# Patient Record
Sex: Female | Born: 1980
Health system: Southern US, Community
[De-identification: ages and names within clinical notes are randomized; demographics above are authoritative.]

## PROBLEM LIST (undated history)

## (undated) DIAGNOSIS — D219 Benign neoplasm of connective and other soft tissue, unspecified: Secondary | ICD-10-CM

## (undated) DIAGNOSIS — Z8619 Personal history of other infectious and parasitic diseases: Secondary | ICD-10-CM

## (undated) DIAGNOSIS — Z789 Other specified health status: Secondary | ICD-10-CM

## (undated) HISTORY — DX: Personal history of other infectious and parasitic diseases: Z86.19

## (undated) HISTORY — DX: Benign neoplasm of connective and other soft tissue, unspecified: D21.9

## (undated) HISTORY — PX: NO PAST SURGERIES: SHX2092

---
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
## 2000-08-04 ENCOUNTER — Other Ambulatory Visit: Admission: RE | Admit: 2000-08-04 | Discharge: 2000-08-04 | Payer: Self-pay | Admitting: Obstetrics and Gynecology

## 2000-09-29 ENCOUNTER — Encounter: Payer: Self-pay | Admitting: Obstetrics and Gynecology

## 2000-09-29 ENCOUNTER — Ambulatory Visit (HOSPITAL_COMMUNITY): Admission: RE | Admit: 2000-09-29 | Discharge: 2000-09-29 | Payer: Self-pay | Admitting: Obstetrics and Gynecology

## 2005-01-27 HISTORY — PX: AUGMENTATION MAMMAPLASTY: SUR837

## 2007-04-24 ENCOUNTER — Emergency Department (HOSPITAL_COMMUNITY): Admission: EM | Admit: 2007-04-24 | Discharge: 2007-04-24 | Payer: Self-pay | Admitting: Emergency Medicine

## 2009-03-17 ENCOUNTER — Emergency Department (HOSPITAL_COMMUNITY): Admission: EM | Admit: 2009-03-17 | Discharge: 2009-03-17 | Payer: Self-pay | Admitting: Emergency Medicine

## 2009-10-05 ENCOUNTER — Emergency Department (HOSPITAL_COMMUNITY): Admission: EM | Admit: 2009-10-05 | Discharge: 2009-10-05 | Payer: Self-pay | Admitting: Family Medicine

## 2011-02-12 ENCOUNTER — Ambulatory Visit: Payer: Self-pay | Admitting: Internal Medicine

## 2011-03-22 ENCOUNTER — Emergency Department: Payer: Self-pay | Admitting: Internal Medicine

## 2014-05-10 ENCOUNTER — Encounter: Payer: Self-pay | Admitting: *Deleted

## 2014-11-09 LAB — OB RESULTS CONSOLE RPR: RPR: NONREACTIVE

## 2014-11-09 LAB — OB RESULTS CONSOLE ANTIBODY SCREEN: ANTIBODY SCREEN: NEGATIVE

## 2014-11-09 LAB — OB RESULTS CONSOLE ABO/RH: RH TYPE: POSITIVE

## 2014-11-09 LAB — OB RESULTS CONSOLE HIV ANTIBODY (ROUTINE TESTING): HIV: NONREACTIVE

## 2014-11-09 LAB — OB RESULTS CONSOLE HEPATITIS B SURFACE ANTIGEN: Hepatitis B Surface Ag: NEGATIVE

## 2014-11-09 LAB — OB RESULTS CONSOLE RUBELLA ANTIBODY, IGM: Rubella: IMMUNE

## 2014-11-27 LAB — OB RESULTS CONSOLE GC/CHLAMYDIA
Chlamydia: NEGATIVE
GC PROBE AMP, GENITAL: NEGATIVE

## 2014-11-29 ENCOUNTER — Ambulatory Visit: Payer: Self-pay | Admitting: Internal Medicine

## 2014-12-29 ENCOUNTER — Encounter: Payer: Self-pay | Admitting: Internal Medicine

## 2015-01-28 NOTE — L&D Delivery Note (Signed)
Delivery Note At 8:53 AM a viable female was delivered via Vaginal, Spontaneous Delivery (Presentation: ; Occiput Anterior).  APGAR: 9,9 ; weight  pending.   Placenta status: Intact, Spontaneous.  Cord:  with the following complications none: .  Cord pH: not obtained  Anesthesia: Epidural  Episiotomy: None Lacerations: 1st degree;Perineal Suture Repair: 3.0 chromic Est. Blood Loss (mL):  300  Mom to postpartum.  Baby to Couplet care / Skin to Skin.  Ariel Joseph L 06/12/2015, 9:35 AM

## 2015-02-05 DIAGNOSIS — M9903 Segmental and somatic dysfunction of lumbar region: Secondary | ICD-10-CM | POA: Diagnosis not present

## 2015-02-05 DIAGNOSIS — M62830 Muscle spasm of back: Secondary | ICD-10-CM | POA: Diagnosis not present

## 2015-02-08 DIAGNOSIS — M62830 Muscle spasm of back: Secondary | ICD-10-CM | POA: Diagnosis not present

## 2015-02-08 DIAGNOSIS — M9903 Segmental and somatic dysfunction of lumbar region: Secondary | ICD-10-CM | POA: Diagnosis not present

## 2015-03-19 DIAGNOSIS — Z3400 Encounter for supervision of normal first pregnancy, unspecified trimester: Secondary | ICD-10-CM | POA: Diagnosis not present

## 2015-03-19 DIAGNOSIS — Z36 Encounter for antenatal screening of mother: Secondary | ICD-10-CM | POA: Diagnosis not present

## 2015-03-19 DIAGNOSIS — Z23 Encounter for immunization: Secondary | ICD-10-CM | POA: Diagnosis not present

## 2015-05-08 DIAGNOSIS — O365930 Maternal care for other known or suspected poor fetal growth, third trimester, not applicable or unspecified: Secondary | ICD-10-CM | POA: Diagnosis not present

## 2015-05-08 DIAGNOSIS — Z3A34 34 weeks gestation of pregnancy: Secondary | ICD-10-CM | POA: Diagnosis not present

## 2015-05-08 DIAGNOSIS — Z3400 Encounter for supervision of normal first pregnancy, unspecified trimester: Secondary | ICD-10-CM | POA: Diagnosis not present

## 2015-05-23 DIAGNOSIS — Z36 Encounter for antenatal screening of mother: Secondary | ICD-10-CM | POA: Diagnosis not present

## 2015-06-07 LAB — OB RESULTS CONSOLE GBS: GBS: POSITIVE

## 2015-06-12 ENCOUNTER — Encounter (HOSPITAL_COMMUNITY): Payer: Self-pay

## 2015-06-12 ENCOUNTER — Inpatient Hospital Stay (HOSPITAL_COMMUNITY): Payer: 59 | Admitting: Anesthesiology

## 2015-06-12 ENCOUNTER — Inpatient Hospital Stay (HOSPITAL_COMMUNITY)
Admission: AD | Admit: 2015-06-12 | Discharge: 2015-06-13 | DRG: 775 | Disposition: A | Discharge status: Home / Self Care | Payer: 59 | Source: Ambulatory Visit | Attending: Obstetrics and Gynecology | Admitting: Obstetrics and Gynecology

## 2015-06-12 DIAGNOSIS — Z3A39 39 weeks gestation of pregnancy: Secondary | ICD-10-CM

## 2015-06-12 DIAGNOSIS — O99824 Streptococcus B carrier state complicating childbirth: Secondary | ICD-10-CM | POA: Diagnosis present

## 2015-06-12 DIAGNOSIS — Z87891 Personal history of nicotine dependence: Secondary | ICD-10-CM

## 2015-06-12 DIAGNOSIS — IMO0001 Reserved for inherently not codable concepts without codable children: Secondary | ICD-10-CM

## 2015-06-12 DIAGNOSIS — O4202 Full-term premature rupture of membranes, onset of labor within 24 hours of rupture: Principal | ICD-10-CM | POA: Diagnosis present

## 2015-06-12 HISTORY — DX: Other specified health status: Z78.9

## 2015-06-12 LAB — RPR: RPR: NONREACTIVE

## 2015-06-12 LAB — CBC
HCT: 38.7 % (ref 36.0–46.0)
HEMOGLOBIN: 14.7 g/dL (ref 12.0–15.0)
MCH: 35.3 pg — ABNORMAL HIGH (ref 26.0–34.0)
MCHC: 38 g/dL — ABNORMAL HIGH (ref 30.0–36.0)
MCV: 92.8 fL (ref 78.0–100.0)
PLATELETS: 190 10*3/uL (ref 150–400)
RBC: 4.17 MIL/uL (ref 3.87–5.11)
RDW: 13.6 % (ref 11.5–15.5)
WBC: 11.7 10*3/uL — AB (ref 4.0–10.5)

## 2015-06-12 LAB — TYPE AND SCREEN
ABO/RH(D): O POS
ANTIBODY SCREEN: NEGATIVE

## 2015-06-12 LAB — ABO/RH: ABO/RH(D): O POS

## 2015-06-12 MED ORDER — OXYCODONE-ACETAMINOPHEN 5-325 MG PO TABS: 1.0000 | ORAL_TABLET | ORAL | Status: DC | PRN

## 2015-06-12 MED ORDER — PENICILLIN G POTASSIUM 5000000 UNITS IJ SOLR
5.0000 10*6.[IU] | Freq: Once | INTRAVENOUS | Status: AC
  Administered 2015-06-12: 5 10*6.[IU] via INTRAVENOUS
  Filled 2015-06-12: qty 5

## 2015-06-12 MED ORDER — LIDOCAINE HCL (PF) 1 % IJ SOLN
INTRAMUSCULAR | Status: DC | PRN
  Administered 2015-06-12 (×2): 4 mL via EPIDURAL

## 2015-06-12 MED ORDER — LACTATED RINGERS IV SOLN
INTRAVENOUS | Status: DC
  Administered 2015-06-12 (×2): via INTRAVENOUS

## 2015-06-12 MED ORDER — OXYTOCIN BOLUS FROM INFUSION: 500.0000 mL | INTRAVENOUS | Status: DC

## 2015-06-12 MED ORDER — EPHEDRINE 5 MG/ML INJ
10.0000 mg | INTRAVENOUS | Status: DC | PRN
  Filled 2015-06-12: qty 2

## 2015-06-12 MED ORDER — TETANUS-DIPHTH-ACELL PERTUSSIS 5-2.5-18.5 LF-MCG/0.5 IM SUSP: 0.5000 mL | Freq: Once | INTRAMUSCULAR | Status: DC

## 2015-06-12 MED ORDER — ACETAMINOPHEN 325 MG PO TABS
650.0000 mg | ORAL_TABLET | ORAL | Status: DC | PRN
  Administered 2015-06-12: 650 mg via ORAL
  Filled 2015-06-12: qty 2

## 2015-06-12 MED ORDER — PRENATAL MULTIVITAMIN CH
1.0000 | ORAL_TABLET | Freq: Every day | ORAL | Status: DC
  Filled 2015-06-12: qty 1

## 2015-06-12 MED ORDER — MEDROXYPROGESTERONE ACETATE 150 MG/ML IM SUSP: 150.0000 mg | INTRAMUSCULAR | Status: DC | PRN

## 2015-06-12 MED ORDER — DIPHENHYDRAMINE HCL 50 MG/ML IJ SOLN: 12.5000 mg | INTRAMUSCULAR | Status: DC | PRN

## 2015-06-12 MED ORDER — OXYCODONE-ACETAMINOPHEN 5-325 MG PO TABS: 2.0000 | ORAL_TABLET | ORAL | Status: DC | PRN

## 2015-06-12 MED ORDER — MEASLES, MUMPS & RUBELLA VAC ~~LOC~~ INJ
0.5000 mL | INJECTION | Freq: Once | SUBCUTANEOUS | Status: DC
  Filled 2015-06-12: qty 0.5

## 2015-06-12 MED ORDER — ZOLPIDEM TARTRATE 5 MG PO TABS: 5.0000 mg | ORAL_TABLET | Freq: Every evening | ORAL | Status: DC | PRN

## 2015-06-12 MED ORDER — BENZOCAINE-MENTHOL 20-0.5 % EX AERO
1.0000 "application " | INHALATION_SPRAY | CUTANEOUS | Status: DC | PRN
  Administered 2015-06-12: 1 via TOPICAL
  Filled 2015-06-12: qty 56

## 2015-06-12 MED ORDER — PENICILLIN G POTASSIUM 5000000 UNITS IJ SOLR
2.5000 10*6.[IU] | INTRAVENOUS | Status: DC
  Administered 2015-06-12: 2.5 10*6.[IU] via INTRAVENOUS
  Filled 2015-06-12 (×2): qty 2.5

## 2015-06-12 MED ORDER — LIDOCAINE HCL (PF) 1 % IJ SOLN
30.0000 mL | INTRAMUSCULAR | Status: DC | PRN
  Filled 2015-06-12: qty 30

## 2015-06-12 MED ORDER — FLEET ENEMA 7-19 GM/118ML RE ENEM: 1.0000 | ENEMA | RECTAL | Status: DC | PRN

## 2015-06-12 MED ORDER — PHENYLEPHRINE 40 MCG/ML (10ML) SYRINGE FOR IV PUSH (FOR BLOOD PRESSURE SUPPORT)
80.0000 ug | PREFILLED_SYRINGE | INTRAVENOUS | Status: DC | PRN
  Filled 2015-06-12: qty 5

## 2015-06-12 MED ORDER — IBUPROFEN 600 MG PO TABS
600.0000 mg | ORAL_TABLET | Freq: Four times a day (QID) | ORAL | Status: DC
  Administered 2015-06-12 – 2015-06-13 (×4): 600 mg via ORAL
  Filled 2015-06-12 (×5): qty 1

## 2015-06-12 MED ORDER — OXYTOCIN 40 UNITS IN LACTATED RINGERS INFUSION - SIMPLE MED
2.5000 [IU]/h | INTRAVENOUS | Status: DC
  Administered 2015-06-12: 39.96 [IU]/h via INTRAVENOUS
  Filled 2015-06-12: qty 1000

## 2015-06-12 MED ORDER — ACETAMINOPHEN 325 MG PO TABS
650.0000 mg | ORAL_TABLET | ORAL | Status: DC | PRN
  Administered 2015-06-12 – 2015-06-13 (×2): 650 mg via ORAL
  Filled 2015-06-12 (×2): qty 2

## 2015-06-12 MED ORDER — PHENYLEPHRINE 40 MCG/ML (10ML) SYRINGE FOR IV PUSH (FOR BLOOD PRESSURE SUPPORT)
PREFILLED_SYRINGE | INTRAVENOUS | Status: AC
  Filled 2015-06-12: qty 20

## 2015-06-12 MED ORDER — OXYCODONE HCL 5 MG PO TABS: 5.0000 mg | ORAL_TABLET | ORAL | Status: DC | PRN

## 2015-06-12 MED ORDER — ONDANSETRON HCL 4 MG/2ML IJ SOLN: 4.0000 mg | Freq: Four times a day (QID) | INTRAMUSCULAR | Status: DC | PRN

## 2015-06-12 MED ORDER — FLEET ENEMA 7-19 GM/118ML RE ENEM: 1.0000 | ENEMA | Freq: Every day | RECTAL | Status: DC | PRN

## 2015-06-12 MED ORDER — OXYCODONE HCL 5 MG PO TABS: 10.0000 mg | ORAL_TABLET | ORAL | Status: DC | PRN

## 2015-06-12 MED ORDER — SENNOSIDES-DOCUSATE SODIUM 8.6-50 MG PO TABS
2.0000 | ORAL_TABLET | ORAL | Status: DC
  Filled 2015-06-12: qty 2

## 2015-06-12 MED ORDER — DIBUCAINE 1 % RE OINT: 1.0000 "application " | TOPICAL_OINTMENT | RECTAL | Status: DC | PRN

## 2015-06-12 MED ORDER — BISACODYL 10 MG RE SUPP: 10.0000 mg | Freq: Every day | RECTAL | Status: DC | PRN

## 2015-06-12 MED ORDER — LACTATED RINGERS IV SOLN: 500.0000 mL | INTRAVENOUS | Status: DC | PRN

## 2015-06-12 MED ORDER — FENTANYL 2.5 MCG/ML BUPIVACAINE 1/10 % EPIDURAL INFUSION (WH - ANES)
INTRAMUSCULAR | Status: AC
  Filled 2015-06-12: qty 125

## 2015-06-12 MED ORDER — ONDANSETRON HCL 4 MG/2ML IJ SOLN: 4.0000 mg | INTRAMUSCULAR | Status: DC | PRN

## 2015-06-12 MED ORDER — ONDANSETRON HCL 4 MG PO TABS: 4.0000 mg | ORAL_TABLET | ORAL | Status: DC | PRN

## 2015-06-12 MED ORDER — LACTATED RINGERS IV SOLN
500.0000 mL | Freq: Once | INTRAVENOUS | Status: AC
  Administered 2015-06-12: 500 mL via INTRAVENOUS

## 2015-06-12 MED ORDER — DIPHENHYDRAMINE HCL 25 MG PO CAPS
25.0000 mg | ORAL_CAPSULE | Freq: Four times a day (QID) | ORAL | Status: DC | PRN
Start: 2015-06-12 — End: 2015-06-13

## 2015-06-12 MED ORDER — COCONUT OIL OIL: 1.0000 "application " | TOPICAL_OIL | Status: DC | PRN

## 2015-06-12 MED ORDER — FENTANYL 2.5 MCG/ML BUPIVACAINE 1/10 % EPIDURAL INFUSION (WH - ANES)
14.0000 mL/h | INTRAMUSCULAR | Status: DC | PRN
  Administered 2015-06-12 (×2): 14 mL/h via EPIDURAL

## 2015-06-12 MED ORDER — SIMETHICONE 80 MG PO CHEW
80.0000 mg | CHEWABLE_TABLET | ORAL | Status: DC | PRN
  Administered 2015-06-12: 80 mg via ORAL

## 2015-06-12 MED ORDER — CITRIC ACID-SODIUM CITRATE 334-500 MG/5ML PO SOLN: 30.0000 mL | ORAL | Status: DC | PRN

## 2015-06-12 MED ORDER — WITCH HAZEL-GLYCERIN EX PADS
1.0000 | MEDICATED_PAD | CUTANEOUS | Status: DC | PRN
Start: 2015-06-12 — End: 2015-06-13

## 2015-06-12 NOTE — Anesthesia Procedure Notes (Signed)
Epidural Patient location during procedure: OB Start time: 06/12/2015 4:00 AM  Staffing Anesthesiologist: Josephine Igo Performed by: anesthesiologist   Preanesthetic Checklist Completed: patient identified, site marked, surgical consent, pre-op evaluation, timeout performed, IV checked, risks and benefits discussed and monitors and equipment checked  Epidural Patient position: sitting Prep: site prepped and draped and DuraPrep Patient monitoring: continuous pulse ox and blood pressure Approach: midline Location: L3-L4 Injection technique: LOR air  Needle:  Needle type: Tuohy  Needle gauge: 17 G Needle length: 9 cm and 9 Needle insertion depth: 4 cm Catheter type: closed end flexible Catheter size: 19 Gauge Catheter at skin depth: 9 cm Test dose: negative and Other  Assessment Events: blood not aspirated, injection not painful, no injection resistance, negative IV test and no paresthesia  Additional Notes Patient identified. Risks and benefits discussed including failed block, incomplete  Pain control, post dural puncture headache, nerve damage, paralysis, blood pressure Changes, nausea, vomiting, reactions to medications-both toxic and allergic and post Partum back pain. All questions were answered. Patient expressed understanding and wished to proceed. Sterile technique was used throughout procedure. Epidural site was Dressed with sterile barrier dressing. No paresthesias, signs of intravascular injection Or signs of intrathecal spread were encountered.  Patient was more comfortable after the epidural was dosed. Please see RN's note for documentation of vital signs and FHR which are stable.

## 2015-06-12 NOTE — Anesthesia Postprocedure Evaluation (Signed)
Anesthesia Post Note  Patient: Ariel Joseph  Procedure(s) Performed: * No procedures listed *  Patient location during evaluation: Mother Baby Anesthesia Type: Epidural Level of consciousness: awake Pain management: satisfactory to patient Vital Signs Assessment: post-procedure vital signs reviewed and stable Respiratory status: spontaneous breathing Cardiovascular status: stable Anesthetic complications: no Comments: Current pain 2     Last Vitals:  Filed Vitals:   06/12/15 1015 06/12/15 1030  BP: 131/70 129/76  Pulse: 58 67  Temp: 36.9 C   Resp: 18     Last Pain:  Filed Vitals:   06/12/15 1207  PainSc: 0-No pain   Pain Goal: Patients Stated Pain Goal: 7 (06/12/15 0730)               Casimer Lanius

## 2015-06-12 NOTE — Anesthesia Preprocedure Evaluation (Signed)
Anesthesia Evaluation  Patient identified by MRN, date of birth, ID band Patient awake    Reviewed: Allergy & Precautions, H&P , Patient's Chart, lab work & pertinent test results  Airway Mallampati: II  TM Distance: >3 FB Neck ROM: full    Dental no notable dental hx. (+) Teeth Intact   Pulmonary neg pulmonary ROS, former smoker,    Pulmonary exam normal breath sounds clear to auscultation       Cardiovascular negative cardio ROS Normal cardiovascular exam Rhythm:regular Rate:Normal     Neuro/Psych negative neurological ROS  negative psych ROS   GI/Hepatic negative GI ROS, Neg liver ROS,   Endo/Other  negative endocrine ROS  Renal/GU negative Renal ROS  negative genitourinary   Musculoskeletal   Abdominal   Peds  Hematology negative hematology ROS (+)   Anesthesia Other Findings   Reproductive/Obstetrics (+) Pregnancy                             Anesthesia Physical Anesthesia Plan  ASA: II  Anesthesia Plan: Epidural   Post-op Pain Management:    Induction:   Airway Management Planned:   Additional Equipment:   Intra-op Plan:   Post-operative Plan:   Informed Consent: I have reviewed the patients History and Physical, chart, labs and discussed the procedure including the risks, benefits and alternatives for the proposed anesthesia with the patient or authorized representative who has indicated his/her understanding and acceptance.     Plan Discussed with: Anesthesiologist  Anesthesia Plan Comments:         Anesthesia Quick Evaluation

## 2015-06-12 NOTE — Lactation Note (Signed)
This note was copied from a baby's chart. Lactation Consultation Note initial visit at 38 hours of age.  FOB holding baby STS after bath and mom is eating.  RN reports mom attempted feeding, but baby is sleepy.  MOm is [employer] employee and will plan to get DEBP from store. [facility] LC resources given and discussed.  Encouraged to feed with early cues on demand.  Early newborn behavior discussed.  Hand expression demonstrated with drops of colostrum visible with earlier attempts.  LC encouraged mom to keep trying.   Mom to call for assist as needed.    Patient Name: Ariel Joseph [id number] Date: 06/12/2015 Reason for consult: Initial assessment   Maternal Data Has patient been taught Hand Expression?: Yes Does the patient have breastfeeding experience prior to this delivery?: No  Feeding Feeding Type: Breast Fed Length of feed: 20 min  LATCH Score/Interventions                Intervention(s): Breastfeeding basics reviewed     Lactation Tools Discussed/Used WIC Program: No   Consult Status Consult Status: Follow-up Date: 06/13/15 Follow-up type: In-patient    Kendell Bane Justine Null 06/12/2015, 9:58 PM

## 2015-06-12 NOTE — Anesthesia Rounding Note (Addendum)
  [facility] Epidural Rounding Note  Patient: Ariel Joseph, 35 y.o., female  Patient's current pain level: Pain Score: 0-No pain (06/12/15 0730)  Agreed upon pain management level:6  Epidural intervention: No   Comments:   [name] 06/12/2015

## 2015-06-12 NOTE — H&P (Signed)
Ariel Joseph is a 35 y.o. G 1 P 0 at 63 w 3 days presents with SROM. Maternal Medical History:  Reason for admission: Rupture of membranes and contractions.     OB History    Gravida Para Term Preterm AB TAB SAB Ectopic Multiple Living   1              Past Medical History  Diagnosis Date  . Medical history non-contributory    Past Surgical History  Procedure Laterality Date  . No past surgeries    . Wisdom tooth extraction  2000   Family History: family history is not on file. Social History:  reports that she has quit smoking. She has never used smokeless tobacco. She reports that she drinks alcohol. She reports that she does not use illicit drugs.   Prenatal Transfer Tool  Maternal Diabetes: No Genetic Screening: Normal Maternal Ultrasounds/Referrals: Normal Fetal Ultrasounds or other Referrals:  None Maternal Substance Abuse:  No Significant Maternal Medications:  None Significant Maternal Lab Results:  None Other Comments:  None  Review of Systems  All other systems reviewed and are negative.   Dilation: 10 Effacement (%): 70, 80 Station: +2 Exam by:: Nicky Kras Blood pressure 106/53, pulse 48, temperature 98.2 F (36.8 C), temperature source Oral, resp. rate 18, height 5\' 3"  (1.6 m), weight 65.318 kg (144 lb), SpO2 100 %. Maternal Exam:  Abdomen: Fetal presentation: vertex     Fetal Exam Fetal State Assessment: Category I - tracings are normal.     Physical Exam  Prenatal labs: ABO, Rh: --/--/O POS, O POS (05/16 0245) Antibody: NEG (05/16 0245) Rubella: Immune (10/13 0000) RPR: Nonreactive (10/13 0000)  HBsAg: Negative (10/13 0000)  HIV: Non-reactive (10/13 0000)  GBS: Positive (05/11 0000)   Assessment/Plan: IUP at 39 w 3 days Labor Anticipate NSVD   Retia Cordle L 06/12/2015, 8:02 AM

## 2015-06-12 NOTE — Lactation Note (Signed)
This note was copied from a baby's chart. Lactation Consultation Note attempted visit at 25 hours of age.  Baby is about to get bath and mom wants to take pictures.  LC offered to return later and mom is planning to call RN or LC at feeding for LATCH score.  Mom reports she is planning to feed and then will be going to sleep.  Mom will need additional teaching regarding feeding cues and cue based feeding.  [facility] resources left in room for mom to review.     Patient Name: Ariel Joseph [id number] Date: 06/12/2015     Maternal Data    Feeding Feeding Type: Breast Fed Length of feed: 20 min  LATCH Score/Interventions                      Lactation Tools Discussed/Used     Consult Status      Kenli Waldo, Justine Null 06/12/2015, 8:57 PM

## 2015-06-12 NOTE — MAU Note (Signed)
Pt states she felt gush of fluid at 1045. Now having contractions anywhere from 2.5-3 minutes. Denies vag bleeding. +FM

## 2015-06-13 LAB — CBC
HEMATOCRIT: 31.5 % — AB (ref 36.0–46.0)
HEMOGLOBIN: 11.5 g/dL — AB (ref 12.0–15.0)
MCH: 35 pg — ABNORMAL HIGH (ref 26.0–34.0)
MCHC: 36.5 g/dL — ABNORMAL HIGH (ref 30.0–36.0)
MCV: 95.7 fL (ref 78.0–100.0)
Platelets: 160 10*3/uL (ref 150–400)
RBC: 3.29 MIL/uL — ABNORMAL LOW (ref 3.87–5.11)
RDW: 13.9 % (ref 11.5–15.5)
WBC: 11.6 10*3/uL — AB (ref 4.0–10.5)

## 2015-06-13 NOTE — Lactation Note (Signed)
This note was copied from a baby's chart. Lactation Consultation Note  Patient Name: Ariel Joseph [id number] Date: 06/13/2015 Reason for consult: Follow-up assessment  Baby 89 hours old. Parents report that baby sleepy since returning from circumcision this morning. Offered to assist with latching baby, but mom declined stating that she just attempted to latch and baby too sleepy. Enc mom to offer lots of STS and hand express drops of EBM to dribble in baby's mouth or use finger to place in baby's mouth. Discussed how this can often enc baby to wake and nurse. Answered mom's questions about pumping/nursing and returning to work. Referred parents to Baby and Me booklet for number of diapers to expect by day of life and EBM storage guidelines. Parents aware of OP/BFSG and [facility] phone line assistance after D/C.  Maternal Data    Feeding    LATCH Score/Interventions                      Lactation Tools Discussed/Used     Consult Status Consult Status: Complete    Inocente Salles 06/13/2015, 12:35 PM

## 2015-06-13 NOTE — Discharge Summary (Signed)
Obstetric Discharge Summary Reason for Admission: onset of labor Prenatal Procedures: none Intrapartum Procedures: spontaneous vaginal delivery Postpartum Procedures: none Complications-Operative and Postpartum: none HEMOGLOBIN  Date Value Ref Range Status  06/13/2015 11.5* 12.0 - 15.0 g/dL Final    Comment:    DELTA CHECK NOTED REPEATED TO VERIFY    HCT  Date Value Ref Range Status  06/13/2015 31.5* 36.0 - 46.0 % Final    Physical Exam:  General: alert Lochia: appropriate Uterine Fundus: firm Incision: healing well DVT Evaluation: No evidence of DVT seen on physical exam.  Discharge Diagnoses: Term Pregnancy-delivered  Discharge Information: Date: 06/13/2015 Activity: pelvic rest Diet: routine Medications: PNV and Ibuprofen Condition: stable Instructions: refer to practice specific booklet Discharge to: home   Newborn Data: Live born female  Birth Weight: 6 lb 14.1 oz (3121 g) APGAR: 7, 9  Home with mother.  Ariel Joseph M 06/13/2015, 10:45 AM

## 2015-06-30 ENCOUNTER — Telehealth (HOSPITAL_COMMUNITY): Payer: Self-pay | Admitting: Lactation Services

## 2015-06-30 NOTE — Telephone Encounter (Signed)
Mom called Lactation number & left a voicemail about baby being very gassy. Called mom back - pt reports that baby is 63 weeks old, wt gain has been above expected & that baby has been very gassy lately. Mom reports she tried cutting out dairy and several other things but did not notice a change; mom also stated that baby used to BF for ~20 mins but now is only BF for ~5 mins every 1.5hrs & is choking, spitty while BF so wondered if she had a lot of milk so she pumped and got 3 oz in ~4 mins. Mom reports baby's stools are sometimes yellow & sometimes green. Discussed how she could have an overactive let down & baby could be getting mostly foremilk. Encouraged mom to pre-pump for ~5 mins and then try laid-back nursing to see if that helps with flow. Discussed how stools should be yellow by now; encouraged mom to massage breasts while BF to increase fat content in her milk. Mom asked how long she should do this (prepump) & encouraged mom to do it over the next few days before every feeding to see if she sees any changes and if not that she can call us back and we may want to see her for an outpatient appointment. Mom asked if she should cut out food items and discussed how she should try these things first but that if she does cut out food items in the future that it can take 2 weeks for her to see a full change in how baby acts. Mom reports no other questions at this time

## 2015-07-23 DIAGNOSIS — Z1389 Encounter for screening for other disorder: Secondary | ICD-10-CM | POA: Diagnosis not present

## 2015-08-03 ENCOUNTER — Ambulatory Visit (HOSPITAL_COMMUNITY)
Admission: RE | Admit: 2015-08-03 | Discharge: 2015-08-03 | Disposition: A | Discharge status: Home / Self Care | Payer: 59 | Source: Ambulatory Visit | Attending: Obstetrics and Gynecology | Admitting: Obstetrics and Gynecology

## 2015-08-03 NOTE — Lactation Note (Signed)
Lactation Consult  Mother's reason for visit:  Visit Type:  Colic, latch assessment, evaluate tongue function Appointment Notes:  Patient is here today with Ariel Joseph related to colic.  She has had him to [facility] for body work.  He is also on zantac for reflux but it not helping ease his pain. Latch assessment reveals that he does not maintain the seal and comes off of the breast several times during the feeding though he has good jaw movement.  Suggested ways to position him to optimize attachment. Ariel Joseph has only been feeding on one breast but mom was encouraged to feed on both sides to help protect her supply and help Ariel Joseph gain more than the minimum weight.  The transfer from the first breast was 1.8 oz. After the second side the total transfer was 3 oz.  He was very uncomfortable and was difficult to console after the feeding. Ariel Joseph has an anterior bubble palate and his tongue does not elevate when he cries. He has a thick lingual frenum and gags when he tries to pull a gloved finger deeply into his mouth.  Mom has been working with him for 7 weeks and is weary.  She was given the names of Drs. McMurty, Crista Curb and Hisaw should she seek further evaluation for "tongue-tie".  Encouraged to follow-up with lactation if Ariel Joseph is revised. Consult:  Initial Lactation Consultant:  Ariel Joseph  ________________________________________________________________________ Ariel Joseph Name: Ariel Joseph Date of Birth: 06/12/2015 Pediatrician: Dr Corinna Capra Gender: female Gestational Age: [redacted]w[redacted]d (At Birth) Birth Weight: 6 lb 14.1 oz (3121 g) Weight at Discharge: Weight: 6 lb 11.2 oz (3040 g)Date of Discharge: 06/13/2015 Filed Weights   06/12/15 0853 06/13/15 0000  Weight: 6 lb 14.1 oz (3121 g) 6 lb 11.2 oz (3040 g)   Last weight taken from location outside of [facility]: 07/12/2015 9 #4 oz Location:Pediatrician's office Weight today: 10# 4.7       ________________________________________________________________________  Mother's Name: Ariel Joseph Type of delivery:   Breastfeeding Experience:  First baby Maternal Medical Conditions:   Maternal Medications:    ________________________________________________________________________  Breastfeeding History (Post Discharge)  Frequency of breastfeeding:  2-3 hours Duration of feeding:  5-7 minutes   Infant Intake and Output Assessment  Voids:  8-10 in 24 hrs.  Color:  Clear yellow Stools:  5-6 in 24 hrs.  Color:  Yellow

## 2015-08-14 ENCOUNTER — Ambulatory Visit (HOSPITAL_COMMUNITY)
Admission: RE | Admit: 2015-08-14 | Discharge: 2015-08-14 | Disposition: A | Discharge status: Home / Self Care | Payer: 59 | Source: Ambulatory Visit | Attending: Obstetrics and Gynecology | Admitting: Obstetrics and Gynecology

## 2015-08-14 NOTE — Lactation Note (Signed)
Lactation Consult  Mother's reason for visit Mother states that :infant  cried for 8 weeks with colic and reflux. Mother  took infant to a Engineer, drilling and she saw that infant had a tongue tie and a lip tie. Mother took infant to Dr Thurman Coyer one week ago. Luisa Hart is 7 day post revision today.  Visit Type:  Appointment Notes:  Consult:  Initial Lactation Consultant:  Darla Lesches  ________________________________________________________________________    ________________________________________________________________________  Mother's Name: [name] Type of delivery:  vaginal del  Breastfeeding Experience:  none Maternal Medical Conditions:  none Maternal Medications:  Prenatal vits  ________________________________________________________________________  Breastfeeding History (Post Discharge)  Frequency of breastfeeding:  Every 2-3 hours Duration of feeding:  10-15 mins    Pumping  Type of pump:  Medela pump in style Frequency:  Once daily Volume:  5 ounces  Infant Intake and Output Assessment  Voids 10:  in 24 hrs.  Color:  Clear yellow Stools:  5-6  in 24 hrs.  Color:  Yellow  ________________________________________________________________________  Maternal Breast Assessment  Breast:  Full Nipple:  Erect Pain level:  0 Pain interventions:  Bra  _______________________________________________________________________  Initial feeding assessment:Mother latched infant on the left breast with good technique. Infants latch was wide with good depth. Both upper and lower lips flanged widely.  Infant transferred 79 ml for first breasts Mother latched infant on the alternate breast with good depth.   Infant's oral assessment:  WNL  Positioning:  Cross cradle Left breast  LATCH documentation:  Latch:  2 = Grasps breast easily, tongue down, lips flanged, rhythmical sucking.  Audible swallowing:  2 = Spontaneous and intermittent  Type of  nipple:  2 = Everted at rest and after stimulation  Comfort (Breast/Nipple):  1 = Filling, red/small blisters or bruises, mild/mod discomfort  Hold (Positioning):  2 = No assistance needed to correctly position infant at breast  LATCH score:  9  Attached assessment:  Shallow  Lips flanged:  Yes.    Lips untucked:  Yes.    Suck assessment:  Displays both  Pre-feed weight:  4972 Post-feed weight:  5028 Amount transferred: 56 ml     Infant's oral assessment:  Variance  Positioning:  Cross cradle Right breast  LATCH documentation:  Latch:  2 = Grasps breast easily, tongue down, lips flanged, rhythmical sucking.  Audible swallowing:  2 = Spontaneous and intermittent  Type of nipple:  2 = Everted at rest and after stimulation  Comfort (Breast/Nipple):  1 = Filling, red/small blisters or bruises, mild/mod discomfort  Hold (Positioning):  2 = No assistance needed to correctly position infant at breast  LATCH score:  9  Attached assessment:  Deep  Lips flanged:  Yes.    Lips untucked:  Yes.    Suck assessment:  Displays both    Pre-feed weight: 5028  Post-feed weight:  5052 Amount transferred:24 ml   Total amount transferred:  80 ml  Advised mother to continue to breastfeed every 2-3 hours Mother to continue to do tongue stretches per Dr Micheline Chapman Mother to continue to post pump once to twice  Daily Introduce a bottle daily for getting ready to go back to work. Follow up with PEDS for 4 month visit LC prn

## 2016-07-08 DIAGNOSIS — N911 Secondary amenorrhea: Secondary | ICD-10-CM | POA: Diagnosis not present

## 2016-07-17 DIAGNOSIS — Z3480 Encounter for supervision of other normal pregnancy, unspecified trimester: Secondary | ICD-10-CM | POA: Diagnosis not present

## 2016-07-17 LAB — OB RESULTS CONSOLE GC/CHLAMYDIA
Chlamydia: NEGATIVE
GC PROBE AMP, GENITAL: NEGATIVE

## 2016-07-17 LAB — OB RESULTS CONSOLE HEPATITIS B SURFACE ANTIGEN: HEP B S AG: NEGATIVE

## 2016-07-17 LAB — OB RESULTS CONSOLE RUBELLA ANTIBODY, IGM: Rubella: IMMUNE

## 2016-07-17 LAB — OB RESULTS CONSOLE HIV ANTIBODY (ROUTINE TESTING): HIV: NONREACTIVE

## 2016-07-17 LAB — OB RESULTS CONSOLE ABO/RH: RH Type: POSITIVE

## 2016-07-17 LAB — OB RESULTS CONSOLE ANTIBODY SCREEN: ANTIBODY SCREEN: NEGATIVE

## 2016-08-14 DIAGNOSIS — Z3491 Encounter for supervision of normal pregnancy, unspecified, first trimester: Secondary | ICD-10-CM | POA: Diagnosis not present

## 2016-08-14 DIAGNOSIS — O09521 Supervision of elderly multigravida, first trimester: Secondary | ICD-10-CM | POA: Diagnosis not present

## 2016-08-14 DIAGNOSIS — Z3A12 12 weeks gestation of pregnancy: Secondary | ICD-10-CM | POA: Diagnosis not present

## 2016-08-14 DIAGNOSIS — Z3682 Encounter for antenatal screening for nuchal translucency: Secondary | ICD-10-CM | POA: Diagnosis not present

## 2016-08-14 DIAGNOSIS — Z3480 Encounter for supervision of other normal pregnancy, unspecified trimester: Secondary | ICD-10-CM | POA: Diagnosis not present

## 2016-09-30 DIAGNOSIS — Z363 Encounter for antenatal screening for malformations: Secondary | ICD-10-CM | POA: Diagnosis not present

## 2016-09-30 DIAGNOSIS — Z3A19 19 weeks gestation of pregnancy: Secondary | ICD-10-CM | POA: Diagnosis not present

## 2016-09-30 DIAGNOSIS — Z3400 Encounter for supervision of normal first pregnancy, unspecified trimester: Secondary | ICD-10-CM | POA: Diagnosis not present

## 2016-11-25 DIAGNOSIS — Z3480 Encounter for supervision of other normal pregnancy, unspecified trimester: Secondary | ICD-10-CM | POA: Diagnosis not present

## 2016-11-25 DIAGNOSIS — Z23 Encounter for immunization: Secondary | ICD-10-CM | POA: Diagnosis not present

## 2016-11-25 DIAGNOSIS — Z3492 Encounter for supervision of normal pregnancy, unspecified, second trimester: Secondary | ICD-10-CM | POA: Diagnosis not present

## 2016-12-04 ENCOUNTER — Ambulatory Visit: Payer: Self-pay | Attending: Obstetrics and Gynecology

## 2016-12-11 ENCOUNTER — Ambulatory Visit: Payer: 59

## 2017-01-07 DIAGNOSIS — M9902 Segmental and somatic dysfunction of thoracic region: Secondary | ICD-10-CM | POA: Diagnosis not present

## 2017-01-07 DIAGNOSIS — M9903 Segmental and somatic dysfunction of lumbar region: Secondary | ICD-10-CM | POA: Diagnosis not present

## 2017-01-07 DIAGNOSIS — M542 Cervicalgia: Secondary | ICD-10-CM | POA: Diagnosis not present

## 2017-01-07 DIAGNOSIS — M545 Low back pain: Secondary | ICD-10-CM | POA: Diagnosis not present

## 2017-01-07 DIAGNOSIS — M9905 Segmental and somatic dysfunction of pelvic region: Secondary | ICD-10-CM | POA: Diagnosis not present

## 2017-01-07 DIAGNOSIS — M9901 Segmental and somatic dysfunction of cervical region: Secondary | ICD-10-CM | POA: Diagnosis not present

## 2017-01-22 DIAGNOSIS — Z3480 Encounter for supervision of other normal pregnancy, unspecified trimester: Secondary | ICD-10-CM | POA: Diagnosis not present

## 2017-01-26 LAB — OB RESULTS CONSOLE GBS: GBS: NEGATIVE

## 2017-02-18 ENCOUNTER — Telehealth (HOSPITAL_COMMUNITY): Payer: Self-pay | Admitting: *Deleted

## 2017-02-18 ENCOUNTER — Encounter (HOSPITAL_COMMUNITY): Payer: Self-pay | Admitting: *Deleted

## 2017-02-18 NOTE — Telephone Encounter (Signed)
Preadmission screen  

## 2017-02-20 NOTE — H&P (Signed)
Ariel Joseph is a 37 y.o. female presenting for two stage IOL. Pregnancy uncomlicated.  OB History    Gravida Para Term Preterm AB Living   2 1 1     1    SAB TAB Ectopic Multiple Live Births         0 1     Past Medical History:  Diagnosis Date  . Fibroid   . Hx of chlamydia infection   . Medical history non-contributory    Past Surgical History:  Procedure Laterality Date  . NO PAST SURGERIES    . WISDOM TOOTH EXTRACTION  2000   Family History: family history includes Bipolar disorder in her maternal grandfather; Diabetes in her maternal grandmother; Drug abuse in her maternal aunt; Hypertension in her brother and maternal grandmother; Kidney cancer in her maternal grandfather; Lung cancer in her maternal grandfather; Thrombosis in her mother; Thyroid disease in her father and mother. Social History:  reports that she has quit smoking. she has never used smokeless tobacco. She reports that she drinks alcohol. She reports that she does not use drugs.     Maternal Diabetes: No Genetic Screening: Normal Maternal Ultrasounds/Referrals: Normal Fetal Ultrasounds or other Referrals:  None Maternal Substance Abuse:  No Significant Maternal Medications:  None Significant Maternal Lab Results:  None Other Comments:  None  Review of Systems  Eyes: Negative for blurred vision.  Gastrointestinal: Negative for abdominal pain.  Neurological: Negative for headaches.   Maternal Medical History:  Fetal activity: Perceived fetal activity is normal.        Last menstrual period 05/18/2016, unknown if currently breastfeeding. Maternal Exam:  Abdomen: Fetal presentation: vertex     Physical Exam  Cardiovascular: Normal rate and regular rhythm.  Respiratory: Effort normal and breath sounds normal.  GI: Soft. There is no tenderness.  Neurological: She has normal reflexes.    Cx=2/th/vtx 02/17/17  Prenatal labs: ABO, Rh: O/Positive/-- (06/21 0000) Antibody: Negative (06/21  0000) Rubella: Immune (06/21 0000) RPR:    HBsAg: Negative (06/21 0000)  HIV: Non-reactive (06/21 0000)  GBS:     Assessment/Plan: 37 yo G2P1 for two stage IOL-risks reviewed   Shon Millet II 02/20/2017, 2:03 PM

## 2017-02-23 ENCOUNTER — Inpatient Hospital Stay (HOSPITAL_COMMUNITY): Payer: 59 | Admitting: Anesthesiology

## 2017-02-23 ENCOUNTER — Other Ambulatory Visit: Payer: Self-pay

## 2017-02-23 ENCOUNTER — Inpatient Hospital Stay (HOSPITAL_COMMUNITY)
Admission: RE | Admit: 2017-02-23 | Discharge: 2017-02-25 | DRG: 807 | Disposition: A | Discharge status: Home / Self Care | Payer: 59 | Source: Ambulatory Visit | Attending: Obstetrics and Gynecology | Admitting: Obstetrics and Gynecology

## 2017-02-23 ENCOUNTER — Encounter (HOSPITAL_COMMUNITY): Payer: Self-pay

## 2017-02-23 VITALS — BP 107/83 | HR 50 | Temp 98.1°F | Resp 18 | Ht 63.0 in | Wt 145.7 lb

## 2017-02-23 DIAGNOSIS — Z3A40 40 weeks gestation of pregnancy: Secondary | ICD-10-CM

## 2017-02-23 DIAGNOSIS — Z3483 Encounter for supervision of other normal pregnancy, third trimester: Secondary | ICD-10-CM | POA: Diagnosis present

## 2017-02-23 DIAGNOSIS — Z87891 Personal history of nicotine dependence: Secondary | ICD-10-CM | POA: Diagnosis not present

## 2017-02-23 DIAGNOSIS — O480 Post-term pregnancy: Secondary | ICD-10-CM | POA: Diagnosis present

## 2017-02-23 LAB — CBC
HCT: 35.5 % — ABNORMAL LOW (ref 36.0–46.0)
HEMOGLOBIN: 13.1 g/dL (ref 12.0–15.0)
MCH: 35.4 pg — AB (ref 26.0–34.0)
MCHC: 36.9 g/dL — ABNORMAL HIGH (ref 30.0–36.0)
MCV: 95.9 fL (ref 78.0–100.0)
Platelets: 179 10*3/uL (ref 150–400)
RBC: 3.7 MIL/uL — AB (ref 3.87–5.11)
RDW: 13.9 % (ref 11.5–15.5)
WBC: 10.6 10*3/uL — AB (ref 4.0–10.5)

## 2017-02-23 LAB — TYPE AND SCREEN
ABO/RH(D): O POS
Antibody Screen: NEGATIVE

## 2017-02-23 LAB — RPR: RPR: NONREACTIVE

## 2017-02-23 MED ORDER — OXYCODONE-ACETAMINOPHEN 5-325 MG PO TABS: 2.0000 | ORAL_TABLET | ORAL | Status: DC | PRN

## 2017-02-23 MED ORDER — ONDANSETRON HCL 4 MG/2ML IJ SOLN: 4.0000 mg | Freq: Four times a day (QID) | INTRAMUSCULAR | Status: DC | PRN

## 2017-02-23 MED ORDER — LIDOCAINE HCL (PF) 1 % IJ SOLN: 30.0000 mL | INTRAMUSCULAR | Status: DC | PRN

## 2017-02-23 MED ORDER — EPHEDRINE 5 MG/ML INJ
10.0000 mg | INTRAVENOUS | Status: DC | PRN
  Filled 2017-02-23: qty 4

## 2017-02-23 MED ORDER — LIDOCAINE HCL (PF) 1 % IJ SOLN
INTRAMUSCULAR | Status: DC | PRN
  Administered 2017-02-23: 6 mL via EPIDURAL
  Administered 2017-02-23: 4 mL via EPIDURAL

## 2017-02-23 MED ORDER — SENNOSIDES-DOCUSATE SODIUM 8.6-50 MG PO TABS
2.0000 | ORAL_TABLET | ORAL | Status: DC
  Administered 2017-02-23 – 2017-02-24 (×2): 2 via ORAL
  Filled 2017-02-23 (×2): qty 2

## 2017-02-23 MED ORDER — PRENATAL MULTIVITAMIN CH
1.0000 | ORAL_TABLET | Freq: Every day | ORAL | Status: DC
  Administered 2017-02-24: 1 via ORAL
  Filled 2017-02-23: qty 1

## 2017-02-23 MED ORDER — TERBUTALINE SULFATE 1 MG/ML IJ SOLN: 0.2500 mg | Freq: Once | INTRAMUSCULAR | Status: DC | PRN

## 2017-02-23 MED ORDER — OXYCODONE HCL 5 MG PO TABS: 5.0000 mg | ORAL_TABLET | ORAL | Status: DC | PRN

## 2017-02-23 MED ORDER — TETANUS-DIPHTH-ACELL PERTUSSIS 5-2.5-18.5 LF-MCG/0.5 IM SUSP
0.5000 mL | Freq: Once | INTRAMUSCULAR | Status: DC
  Filled 2017-02-23: qty 0.5

## 2017-02-23 MED ORDER — ONDANSETRON HCL 4 MG PO TABS: 4.0000 mg | ORAL_TABLET | ORAL | Status: DC | PRN

## 2017-02-23 MED ORDER — COCONUT OIL OIL
1.0000 "application " | TOPICAL_OIL | Status: DC | PRN
  Filled 2017-02-23: qty 120

## 2017-02-23 MED ORDER — OXYTOCIN 40 UNITS IN LACTATED RINGERS INFUSION - SIMPLE MED: 1.0000 m[IU]/min | INTRAVENOUS | Status: DC

## 2017-02-23 MED ORDER — BUTORPHANOL TARTRATE 1 MG/ML IJ SOLN: 1.0000 mg | INTRAMUSCULAR | Status: DC | PRN

## 2017-02-23 MED ORDER — LACTATED RINGERS IV SOLN
500.0000 mL | INTRAVENOUS | Status: DC | PRN
  Administered 2017-02-23: 500 mL via INTRAVENOUS

## 2017-02-23 MED ORDER — ACETAMINOPHEN 325 MG PO TABS
650.0000 mg | ORAL_TABLET | ORAL | Status: DC | PRN
  Administered 2017-02-23: 650 mg via ORAL
  Filled 2017-02-23: qty 2

## 2017-02-23 MED ORDER — SOD CITRATE-CITRIC ACID 500-334 MG/5ML PO SOLN: 30.0000 mL | ORAL | Status: DC | PRN

## 2017-02-23 MED ORDER — WITCH HAZEL-GLYCERIN EX PADS
1.0000 "application " | MEDICATED_PAD | CUTANEOUS | Status: DC | PRN
  Administered 2017-02-24: 1 via TOPICAL

## 2017-02-23 MED ORDER — PHENYLEPHRINE 40 MCG/ML (10ML) SYRINGE FOR IV PUSH (FOR BLOOD PRESSURE SUPPORT)
80.0000 ug | PREFILLED_SYRINGE | INTRAVENOUS | Status: DC | PRN
  Filled 2017-02-23 (×2): qty 10

## 2017-02-23 MED ORDER — ONDANSETRON HCL 4 MG/2ML IJ SOLN: 4.0000 mg | INTRAMUSCULAR | Status: DC | PRN

## 2017-02-23 MED ORDER — PHENYLEPHRINE 40 MCG/ML (10ML) SYRINGE FOR IV PUSH (FOR BLOOD PRESSURE SUPPORT)
80.0000 ug | PREFILLED_SYRINGE | INTRAVENOUS | Status: AC | PRN
  Administered 2017-02-23 (×3): 80 ug via INTRAVENOUS

## 2017-02-23 MED ORDER — ZOLPIDEM TARTRATE 5 MG PO TABS: 5.0000 mg | ORAL_TABLET | Freq: Every evening | ORAL | Status: DC | PRN

## 2017-02-23 MED ORDER — SIMETHICONE 80 MG PO CHEW: 80.0000 mg | CHEWABLE_TABLET | ORAL | Status: DC | PRN

## 2017-02-23 MED ORDER — FLEET ENEMA 7-19 GM/118ML RE ENEM: 1.0000 | ENEMA | RECTAL | Status: DC | PRN

## 2017-02-23 MED ORDER — IBUPROFEN 600 MG PO TABS
600.0000 mg | ORAL_TABLET | Freq: Four times a day (QID) | ORAL | Status: DC
  Administered 2017-02-23 – 2017-02-25 (×6): 600 mg via ORAL
  Filled 2017-02-23 (×5): qty 1

## 2017-02-23 MED ORDER — OXYCODONE-ACETAMINOPHEN 5-325 MG PO TABS: 1.0000 | ORAL_TABLET | ORAL | Status: DC | PRN

## 2017-02-23 MED ORDER — BENZOCAINE-MENTHOL 20-0.5 % EX AERO
1.0000 "application " | INHALATION_SPRAY | CUTANEOUS | Status: DC | PRN
  Administered 2017-02-23: 1 via TOPICAL
  Filled 2017-02-23 (×2): qty 56

## 2017-02-23 MED ORDER — OXYTOCIN BOLUS FROM INFUSION: 500.0000 mL | Freq: Once | INTRAVENOUS | Status: DC

## 2017-02-23 MED ORDER — OXYCODONE HCL 5 MG PO TABS: 10.0000 mg | ORAL_TABLET | ORAL | Status: DC | PRN

## 2017-02-23 MED ORDER — OXYTOCIN 40 UNITS IN LACTATED RINGERS INFUSION - SIMPLE MED
2.5000 [IU]/h | INTRAVENOUS | Status: DC
  Administered 2017-02-23: 2.5 [IU]/h via INTRAVENOUS
  Filled 2017-02-23: qty 1000

## 2017-02-23 MED ORDER — FENTANYL 2.5 MCG/ML BUPIVACAINE 1/10 % EPIDURAL INFUSION (WH - ANES)
14.0000 mL/h | INTRAMUSCULAR | Status: DC | PRN
  Administered 2017-02-23: 14 mL/h via EPIDURAL
  Filled 2017-02-23: qty 100

## 2017-02-23 MED ORDER — LACTATED RINGERS IV SOLN
INTRAVENOUS | Status: DC
  Administered 2017-02-23 (×2): via INTRAVENOUS

## 2017-02-23 MED ORDER — MISOPROSTOL 25 MCG QUARTER TABLET
25.0000 ug | ORAL_TABLET | ORAL | Status: DC | PRN
Start: 2017-02-23 — End: 2017-02-23
  Administered 2017-02-23 (×2): 25 ug via VAGINAL
  Filled 2017-02-23 (×2): qty 1

## 2017-02-23 MED ORDER — DIPHENHYDRAMINE HCL 50 MG/ML IJ SOLN: 12.5000 mg | INTRAMUSCULAR | Status: DC | PRN

## 2017-02-23 MED ORDER — LACTATED RINGERS IV SOLN
500.0000 mL | Freq: Once | INTRAVENOUS | Status: AC
  Administered 2017-02-23: 12:00:00 via INTRAVENOUS

## 2017-02-23 MED ORDER — DIBUCAINE 1 % RE OINT
1.0000 "application " | TOPICAL_OINTMENT | RECTAL | Status: DC | PRN
  Administered 2017-02-24: 1 via RECTAL
  Filled 2017-02-23 (×2): qty 28

## 2017-02-23 MED ORDER — EPHEDRINE 5 MG/ML INJ
10.0000 mg | INTRAVENOUS | Status: DC | PRN
  Administered 2017-02-23: 10 mg via INTRAVENOUS

## 2017-02-23 MED ORDER — ACETAMINOPHEN 325 MG PO TABS
650.0000 mg | ORAL_TABLET | ORAL | Status: DC | PRN
  Administered 2017-02-24 – 2017-02-25 (×4): 650 mg via ORAL
  Filled 2017-02-23 (×4): qty 2

## 2017-02-23 MED ORDER — DIPHENHYDRAMINE HCL 25 MG PO CAPS: 25.0000 mg | ORAL_CAPSULE | Freq: Four times a day (QID) | ORAL | Status: DC | PRN

## 2017-02-23 NOTE — Anesthesia Procedure Notes (Signed)
Epidural Patient location during procedure: OB Start time: 02/23/2017 12:48 PM End time: 02/23/2017 12:51 PM  Staffing Anesthesiologist: Lyn Hollingshead, MD Performed: anesthesiologist   Preanesthetic Checklist Completed: patient identified, site marked, surgical consent, pre-op evaluation, timeout performed, IV checked, risks and benefits discussed and monitors and equipment checked  Epidural Patient position: sitting Prep: site prepped and draped and DuraPrep Patient monitoring: continuous pulse ox and blood pressure Approach: midline Location: L3-L4 Injection technique: LOR air  Needle:  Needle type: Tuohy  Needle gauge: 17 G Needle length: 9 cm and 9 Needle insertion depth: 4 cm Catheter type: closed end flexible Catheter size: 19 Gauge Catheter at skin depth: 9 cm Test dose: negative  Assessment Sensory level: T9 Events: blood not aspirated, injection not painful, no injection resistance, negative IV test and no paresthesia  Additional Notes Reason for block:procedure for pain

## 2017-02-23 NOTE — Progress Notes (Signed)
Delivery Note At 6:27 PM a viable female was delivered via Vaginal, Spontaneous (Presentation: ROA;  ).  APGAR: , ; weight  .   Placenta status:intact , .  Cord: 3 vessel with the following complications: .  Cord pH: not done Vertex delivered>right posterior hand under chin>right posterior arm delivered without difficulty>complete delivery. Loose nuchal cord x 1 reduced at delivery. Anesthesia:  epidural Episiotomy: None Lacerations: small transverse second degree lac at 6:00 repaired  Suture Repair: 2.0 vicryl rapide Est. Blood Loss (mL):  250 Mom to postpartum.  Baby to Couplet care / Skin to Skin.  Shon Millet II 02/23/2017, 6:39 PM

## 2017-02-23 NOTE — Progress Notes (Signed)
FHT cat one UCs irreg  Cx=2/30 per nurse check Second Cytotec about 5:45 am  A/P: Will check cx in about 2-3 hours          D/W patient

## 2017-02-23 NOTE — Anesthesia Preprocedure Evaluation (Signed)
Anesthesia Evaluation  Patient identified by MRN, date of birth, ID band Patient awake    Reviewed: Allergy & Precautions, H&P , NPO status , Patient's Chart, lab work & pertinent test results  Airway Mallampati: I  TM Distance: >3 FB Neck ROM: full    Dental no notable dental hx. (+) Teeth Intact   Pulmonary former smoker,    Pulmonary exam normal breath sounds clear to auscultation       Cardiovascular negative cardio ROS Normal cardiovascular exam Rhythm:regular Rate:Normal     Neuro/Psych negative neurological ROS  negative psych ROS   GI/Hepatic negative GI ROS, Neg liver ROS,   Endo/Other  negative endocrine ROS  Renal/GU negative Renal ROS  negative genitourinary   Musculoskeletal negative musculoskeletal ROS (+)   Abdominal Normal abdominal exam  (+)   Peds  Hematology negative hematology ROS (+)   Anesthesia Other Findings   Reproductive/Obstetrics (+) Pregnancy                             Anesthesia Physical  Anesthesia Plan  ASA: II  Anesthesia Plan: Epidural   Post-op Pain Management:    Induction:   PONV Risk Score and Plan:   Airway Management Planned:   Additional Equipment:   Intra-op Plan:   Post-operative Plan:   Informed Consent: I have reviewed the patients History and Physical, chart, labs and discussed the procedure including the risks, benefits and alternatives for the proposed anesthesia with the patient or authorized representative who has indicated his/her understanding and acceptance.     Plan Discussed with: Anesthesiologist  Anesthesia Plan Comments:         Anesthesia Quick Evaluation

## 2017-02-23 NOTE — Anesthesia Pain Management Evaluation Note (Signed)
  CRNA Pain Management Visit Note  Patient: Ariel Joseph, 37 y.o., female  "Hello I am a member of the anesthesia team at [facility]. We have an anesthesia team available at all times to provide care throughout the hospital, including epidural management and anesthesia for C-section. I don't know your plan for the delivery whether it a natural birth, water birth, IV sedation, nitrous supplementation, doula or epidural, but we want to meet your pain goals."   1.Was your pain managed to your expectations on prior hospitalizations?   Yes   2.What is your expectation for pain management during this hospitalization?     Epidural  3.How can we help you reach that goal? epidural  Record the patient's initial score and the patient's pain goal.   Pain: 0  Pain Goal: 4 [facility] wants you to be able to say your pain was always managed very well.  Willa Rough 02/23/2017

## 2017-02-23 NOTE — Progress Notes (Signed)
3-4/70/-2/vtx FHT cat one UC q2-4 min  A/P: If UCs space out>begin pitocin         Epidural prn         D/W patient

## 2017-02-23 NOTE — Progress Notes (Signed)
4/C/-1/well applied vtx AROM scant clear fluid FHT cat one, good response to scalp stim UCs q2-3 min Epidural in

## 2017-02-24 LAB — CBC
HCT: 31.3 % — ABNORMAL LOW (ref 36.0–46.0)
Hemoglobin: 11.3 g/dL — ABNORMAL LOW (ref 12.0–15.0)
MCH: 34.9 pg — ABNORMAL HIGH (ref 26.0–34.0)
MCHC: 36.1 g/dL — AB (ref 30.0–36.0)
MCV: 96.6 fL (ref 78.0–100.0)
PLATELETS: 190 10*3/uL (ref 150–400)
RBC: 3.24 MIL/uL — ABNORMAL LOW (ref 3.87–5.11)
RDW: 13.9 % (ref 11.5–15.5)
WBC: 17.1 10*3/uL — AB (ref 4.0–10.5)

## 2017-02-24 NOTE — Progress Notes (Signed)
Post Partum Day 1 Subjective: no complaints  Objective: Blood pressure (!) 99/45, pulse (!) 54, temperature 97.9 F (36.6 C), temperature source Oral, resp. rate 18, height 5\' 3"  (1.6 m), weight 145 lb 11.2 oz (66.1 kg), last menstrual period 05/18/2016, SpO2 100 %, unknown if currently breastfeeding.  Physical Exam:  General: alert and cooperative Lochia: appropriate Uterine Fundus: firm Incision: n/a DVT Evaluation: No evidence of DVT seen on physical exam.  Recent Labs    02/23/17 0050 02/24/17 0543  HGB 13.1 11.3*  HCT 35.5* 31.3*    Assessment/Plan: Plan for discharge tomorrow and Breastfeeding  Desires circ - r/b/a discussed, informed consent   LOS: 1 day   Marylynn Pearson 02/24/2017, 7:37 AM

## 2017-02-24 NOTE — Progress Notes (Signed)
Patient requested no bedside reporting. Mom requested this comment to be put in her chart.

## 2017-02-24 NOTE — Lactation Note (Signed)
This note was copied from a baby's chart. Lactation Consultation Note Baby 7 hrs old. Has been feeding long periods off and on since birth.  Mom has a 71 month old that she BF up until she found out she was pregnant. Mom had no milk supply issue. Main issue mom had was baby had labial and posterior tongue tie that was clipped at 70 weeks old. Mom stated it was a world of difference after that.  This baby is popping off and on frequently. Encouraged support, comfort, and props during feedings to obtain a deeper latch.  Mom has tubular breast w/2 1/2 fingers width between breast. Everted compressible nipples that mom easily hand expresses colostrum.  Mom prefers cross cradle position. Discussed positions, cheeks to breast, chin tug, and breast compression/massage occasionaly during feeding.  Mom discussed BF obstacles that she had w/her 1st child such as fast let down and baby having projectile vomiting. Had several [place] consults at [facility] and [facility] LC's suggested mom to pre-pump some of the fore-milk to help w/gas the baby had. Mom stated her 1st child finally BF got better after tongue tie fix. Had no issues after that.  Discussed cluster feeding, and newborn behavior. Encouraged mom to call for assistance or questions. Mom has her pump from last baby and is in good condition, doesn't need another pump. [place] brochure given w/resources, support groups and [place] services. Patient Name: Ariel Joseph YJEHU'D Date: 02/24/2017 Reason for consult: Initial assessment   Maternal Data Has patient been taught Hand Expression?: Yes Does the patient have breastfeeding experience prior to this delivery?: Yes  Feeding Feeding Type: Breast Fed Length of feed: 40 min  LATCH Score Latch: Grasps breast easily, tongue down, lips flanged, rhythmical sucking.  Audible Swallowing: Spontaneous and intermittent  Type of Nipple: Everted at rest and after stimulation  Comfort (Breast/Nipple): Soft /  non-tender  Hold (Positioning): Assistance needed to correctly position infant at breast and maintain latch.  LATCH Score: 9  Interventions Interventions: Breast feeding basics reviewed;Position options;Support pillows;Breast massage;Skin to skin;Adjust position;Breast compression  Lactation Tools Discussed/Used WIC Program: No   Consult Status Consult Status: Follow-up Date: 02/25/17 Follow-up type: In-patient    Kymere Fullington, Elta Guadeloupe 02/24/2017, 2:11 AM

## 2017-02-24 NOTE — Anesthesia Postprocedure Evaluation (Signed)
Anesthesia Post Note  Patient: Ariel Joseph  Procedure(s) Performed: AN AD [place]     Patient location during evaluation: Mother Baby Anesthesia Type: Epidural Level of consciousness: awake Pain management: pain level controlled Vital Signs Assessment: post-procedure vital signs reviewed and stable Respiratory status: spontaneous breathing Postop Assessment: patient able to bend at knees and epidural receding Anesthetic complications: no    Last Vitals:  Vitals:   02/24/17 0218 02/24/17 0551  BP: (!) 98/58 (!) 99/45  Pulse: (!) 48 (!) 54  Resp: 18   Temp: 36.6 C 36.6 C  SpO2:      Last Pain:  Vitals:   02/24/17 0551  TempSrc: Oral  PainSc: 3    Pain Goal:                 Everette Rank

## 2017-02-24 NOTE — Progress Notes (Signed)
Patient has requested care of herself and infant to be clustered. Per RN report, patient does not want bedside report given in room. Patient is a Therapist, sports. Informed mother that nursing will make our best effort to cluster care and request that mother call as needed. Reviewed plan of care for infant and mother today. NT did bath and hearing screen, Pediatrician in for her assessment, and RN did assessments during brief time span.

## 2017-02-25 NOTE — Discharge Summary (Signed)
Obstetric Discharge Summary Reason for Admission: induction of labor Prenatal Procedures: none Intrapartum Procedures: spontaneous vaginal delivery Postpartum Procedures: none Complications-Operative and Postpartum: 2nd degree perineal laceration Hemoglobin  Date Value Ref Range Status  02/24/2017 11.3 (L) 12.0 - 15.0 g/dL Final   HCT  Date Value Ref Range Status  02/24/2017 31.3 (L) 36.0 - 46.0 % Final    Physical Exam:  General: alert, cooperative and appears stated age 37: appropriate Uterine Fundus: firm Incision: healing well, no significant drainage, no dehiscence DVT Evaluation: No evidence of DVT seen on physical exam.  Discharge Diagnoses: Term Pregnancy-delivered  Discharge Information: Date: 02/25/2017 Activity: pelvic rest Diet: routine Medications: None Condition: improved Instructions: refer to practice specific booklet Discharge to: home   Newborn Data: Live born female  Birth Weight: 7 lb 11.8 oz (3510 g) APGAR: 9, 9  Newborn Delivery   Birth date/time:  02/23/2017 18:27:00 Delivery type:  Vaginal, Spontaneous     Home with mother.  Arath Kaigler L 02/25/2017, 8:06 AM

## 2017-02-25 NOTE — Lactation Note (Signed)
This note was copied from a baby's chart. Lactation Consultation Note  Patient Name: Boy Nehal Witting HTDSK'[id number] Date: 02/25/2017 Reason for consult: Follow-up assessment Mom states baby is feeding well. C/o tenderness on right nipple.  Nipple round and intact when baby came off.  Mom has good technique.  Baby does have a thick labial frenulum and it is difficult to flange upper lip.  Instructed to use colostrum to sore nipple after feeding.  Lactation outpatient services reviewed and encouraged prn.  Maternal Data    Feeding Feeding Type: Breast Fed Length of feed: 15 min  LATCH Score Latch: Grasps breast easily, tongue down, lips flanged, rhythmical sucking.  Audible Swallowing: A few with stimulation  Type of Nipple: Everted at rest and after stimulation  Comfort (Breast/Nipple): Filling, red/small blisters or bruises, mild/mod discomfort  Hold (Positioning): No assistance needed to correctly position infant at breast.  LATCH Score: 8  Interventions    Lactation Tools Discussed/Used     Consult Status Consult Status: Complete    Ave Filter 02/25/2017, 9:14 AM

## 2017-04-09 DIAGNOSIS — Z1389 Encounter for screening for other disorder: Secondary | ICD-10-CM | POA: Diagnosis not present

## 2017-05-05 DIAGNOSIS — M6281 Muscle weakness (generalized): Secondary | ICD-10-CM | POA: Diagnosis not present

## 2017-05-05 DIAGNOSIS — N393 Stress incontinence (female) (male): Secondary | ICD-10-CM | POA: Diagnosis not present

## 2017-07-20 DIAGNOSIS — K219 Gastro-esophageal reflux disease without esophagitis: Secondary | ICD-10-CM | POA: Diagnosis not present

## 2017-07-20 DIAGNOSIS — Z0000 Encounter for general adult medical examination without abnormal findings: Secondary | ICD-10-CM | POA: Diagnosis not present

## 2017-07-27 DIAGNOSIS — Z0000 Encounter for general adult medical examination without abnormal findings: Secondary | ICD-10-CM | POA: Diagnosis not present

## 2017-11-09 DIAGNOSIS — Z6821 Body mass index (BMI) 21.0-21.9, adult: Secondary | ICD-10-CM | POA: Diagnosis not present

## 2017-11-09 DIAGNOSIS — N390 Urinary tract infection, site not specified: Secondary | ICD-10-CM | POA: Diagnosis not present

## 2018-09-09 DIAGNOSIS — Z01419 Encounter for gynecological examination (general) (routine) without abnormal findings: Secondary | ICD-10-CM | POA: Diagnosis not present

## 2018-09-09 DIAGNOSIS — Z6821 Body mass index (BMI) 21.0-21.9, adult: Secondary | ICD-10-CM | POA: Diagnosis not present

## 2018-10-25 DIAGNOSIS — D2272 Melanocytic nevi of left lower limb, including hip: Secondary | ICD-10-CM | POA: Diagnosis not present

## 2018-10-25 DIAGNOSIS — D485 Neoplasm of uncertain behavior of skin: Secondary | ICD-10-CM | POA: Diagnosis not present

## 2018-10-25 DIAGNOSIS — L821 Other seborrheic keratosis: Secondary | ICD-10-CM | POA: Diagnosis not present

## 2018-10-25 DIAGNOSIS — B360 Pityriasis versicolor: Secondary | ICD-10-CM | POA: Diagnosis not present

## 2019-02-01 DIAGNOSIS — Z6821 Body mass index (BMI) 21.0-21.9, adult: Secondary | ICD-10-CM | POA: Diagnosis not present

## 2019-02-01 DIAGNOSIS — Z0000 Encounter for general adult medical examination without abnormal findings: Secondary | ICD-10-CM | POA: Diagnosis not present

## 2019-02-01 DIAGNOSIS — K219 Gastro-esophageal reflux disease without esophagitis: Secondary | ICD-10-CM | POA: Diagnosis not present

## 2019-02-01 DIAGNOSIS — Z23 Encounter for immunization: Secondary | ICD-10-CM | POA: Diagnosis not present

## 2019-02-01 DIAGNOSIS — N390 Urinary tract infection, site not specified: Secondary | ICD-10-CM | POA: Diagnosis not present

## 2019-02-01 DIAGNOSIS — Z20828 Contact with and (suspected) exposure to other viral communicable diseases: Secondary | ICD-10-CM | POA: Diagnosis not present

## 2019-10-11 DIAGNOSIS — Z01419 Encounter for gynecological examination (general) (routine) without abnormal findings: Secondary | ICD-10-CM | POA: Diagnosis not present

## 2019-10-11 DIAGNOSIS — Z6822 Body mass index (BMI) 22.0-22.9, adult: Secondary | ICD-10-CM | POA: Diagnosis not present

## 2019-12-21 DIAGNOSIS — M542 Cervicalgia: Secondary | ICD-10-CM | POA: Diagnosis not present

## 2019-12-21 DIAGNOSIS — M9902 Segmental and somatic dysfunction of thoracic region: Secondary | ICD-10-CM | POA: Diagnosis not present

## 2019-12-21 DIAGNOSIS — M9907 Segmental and somatic dysfunction of upper extremity: Secondary | ICD-10-CM | POA: Diagnosis not present

## 2019-12-21 DIAGNOSIS — M9901 Segmental and somatic dysfunction of cervical region: Secondary | ICD-10-CM | POA: Diagnosis not present

## 2019-12-21 DIAGNOSIS — M25512 Pain in left shoulder: Secondary | ICD-10-CM | POA: Diagnosis not present

## 2019-12-21 DIAGNOSIS — M546 Pain in thoracic spine: Secondary | ICD-10-CM | POA: Diagnosis not present

## 2019-12-28 DIAGNOSIS — M25512 Pain in left shoulder: Secondary | ICD-10-CM | POA: Diagnosis not present

## 2019-12-28 DIAGNOSIS — M9901 Segmental and somatic dysfunction of cervical region: Secondary | ICD-10-CM | POA: Diagnosis not present

## 2019-12-28 DIAGNOSIS — M9902 Segmental and somatic dysfunction of thoracic region: Secondary | ICD-10-CM | POA: Diagnosis not present

## 2019-12-28 DIAGNOSIS — M542 Cervicalgia: Secondary | ICD-10-CM | POA: Diagnosis not present

## 2019-12-28 DIAGNOSIS — M546 Pain in thoracic spine: Secondary | ICD-10-CM | POA: Diagnosis not present

## 2019-12-28 DIAGNOSIS — M9907 Segmental and somatic dysfunction of upper extremity: Secondary | ICD-10-CM | POA: Diagnosis not present

## 2020-01-04 DIAGNOSIS — M9907 Segmental and somatic dysfunction of upper extremity: Secondary | ICD-10-CM | POA: Diagnosis not present

## 2020-01-04 DIAGNOSIS — M546 Pain in thoracic spine: Secondary | ICD-10-CM | POA: Diagnosis not present

## 2020-01-04 DIAGNOSIS — M542 Cervicalgia: Secondary | ICD-10-CM | POA: Diagnosis not present

## 2020-01-04 DIAGNOSIS — M25512 Pain in left shoulder: Secondary | ICD-10-CM | POA: Diagnosis not present

## 2020-01-04 DIAGNOSIS — M9902 Segmental and somatic dysfunction of thoracic region: Secondary | ICD-10-CM | POA: Diagnosis not present

## 2020-01-04 DIAGNOSIS — M9901 Segmental and somatic dysfunction of cervical region: Secondary | ICD-10-CM | POA: Diagnosis not present

## 2020-06-29 DIAGNOSIS — M9905 Segmental and somatic dysfunction of pelvic region: Secondary | ICD-10-CM | POA: Diagnosis not present

## 2020-06-29 DIAGNOSIS — M9903 Segmental and somatic dysfunction of lumbar region: Secondary | ICD-10-CM | POA: Diagnosis not present

## 2020-06-29 DIAGNOSIS — M62830 Muscle spasm of back: Secondary | ICD-10-CM | POA: Diagnosis not present

## 2020-06-29 DIAGNOSIS — M9902 Segmental and somatic dysfunction of thoracic region: Secondary | ICD-10-CM | POA: Diagnosis not present

## 2020-06-29 DIAGNOSIS — M62838 Other muscle spasm: Secondary | ICD-10-CM | POA: Diagnosis not present

## 2020-07-09 DIAGNOSIS — M9905 Segmental and somatic dysfunction of pelvic region: Secondary | ICD-10-CM | POA: Diagnosis not present

## 2020-07-09 DIAGNOSIS — M9903 Segmental and somatic dysfunction of lumbar region: Secondary | ICD-10-CM | POA: Diagnosis not present

## 2020-07-09 DIAGNOSIS — M9902 Segmental and somatic dysfunction of thoracic region: Secondary | ICD-10-CM | POA: Diagnosis not present

## 2020-07-09 DIAGNOSIS — M62838 Other muscle spasm: Secondary | ICD-10-CM | POA: Diagnosis not present

## 2020-10-12 DIAGNOSIS — Z01419 Encounter for gynecological examination (general) (routine) without abnormal findings: Secondary | ICD-10-CM | POA: Diagnosis not present

## 2020-10-12 DIAGNOSIS — Z6822 Body mass index (BMI) 22.0-22.9, adult: Secondary | ICD-10-CM | POA: Diagnosis not present

## 2021-02-07 DIAGNOSIS — Z13228 Encounter for screening for other metabolic disorders: Secondary | ICD-10-CM | POA: Diagnosis not present

## 2021-02-07 DIAGNOSIS — Z13220 Encounter for screening for lipoid disorders: Secondary | ICD-10-CM | POA: Diagnosis not present

## 2021-02-07 DIAGNOSIS — Z1329 Encounter for screening for other suspected endocrine disorder: Secondary | ICD-10-CM | POA: Diagnosis not present

## 2021-02-07 DIAGNOSIS — Z1321 Encounter for screening for nutritional disorder: Secondary | ICD-10-CM | POA: Diagnosis not present

## 2021-02-07 DIAGNOSIS — Z1231 Encounter for screening mammogram for malignant neoplasm of breast: Secondary | ICD-10-CM | POA: Diagnosis not present

## 2021-02-07 DIAGNOSIS — Z130 Encounter for screening for diseases of the blood and blood-forming organs and certain disorders involving the immune mechanism: Secondary | ICD-10-CM | POA: Diagnosis not present

## 2021-02-07 DIAGNOSIS — Z131 Encounter for screening for diabetes mellitus: Secondary | ICD-10-CM | POA: Diagnosis not present

## 2021-02-12 ENCOUNTER — Other Ambulatory Visit: Payer: Self-pay | Admitting: Obstetrics and Gynecology

## 2021-02-12 DIAGNOSIS — R928 Other abnormal and inconclusive findings on diagnostic imaging of breast: Secondary | ICD-10-CM

## 2021-03-05 ENCOUNTER — Ambulatory Visit
Admission: RE | Admit: 2021-03-05 | Discharge: 2021-03-05 | Disposition: A | Discharge status: Home / Self Care | Payer: 59 | Source: Ambulatory Visit | Attending: Obstetrics and Gynecology | Admitting: Obstetrics and Gynecology

## 2021-03-05 DIAGNOSIS — R928 Other abnormal and inconclusive findings on diagnostic imaging of breast: Secondary | ICD-10-CM

## 2021-03-05 DIAGNOSIS — N6489 Other specified disorders of breast: Secondary | ICD-10-CM | POA: Diagnosis not present

## 2021-03-05 DIAGNOSIS — R922 Inconclusive mammogram: Secondary | ICD-10-CM | POA: Diagnosis not present

## 2021-03-12 ENCOUNTER — Other Ambulatory Visit: Payer: 59

## 2021-04-04 DIAGNOSIS — Z0000 Encounter for general adult medical examination without abnormal findings: Secondary | ICD-10-CM | POA: Diagnosis not present

## 2021-04-04 DIAGNOSIS — Z7689 Persons encountering health services in other specified circumstances: Secondary | ICD-10-CM | POA: Diagnosis not present

## 2021-10-01 DIAGNOSIS — M9902 Segmental and somatic dysfunction of thoracic region: Secondary | ICD-10-CM | POA: Diagnosis not present

## 2021-10-01 DIAGNOSIS — R0782 Intercostal pain: Secondary | ICD-10-CM | POA: Diagnosis not present

## 2021-10-01 DIAGNOSIS — M9901 Segmental and somatic dysfunction of cervical region: Secondary | ICD-10-CM | POA: Diagnosis not present

## 2021-10-01 DIAGNOSIS — M546 Pain in thoracic spine: Secondary | ICD-10-CM | POA: Diagnosis not present

## 2022-02-27 DIAGNOSIS — Z01419 Encounter for gynecological examination (general) (routine) without abnormal findings: Secondary | ICD-10-CM | POA: Diagnosis not present

## 2022-02-27 DIAGNOSIS — Z6823 Body mass index (BMI) 23.0-23.9, adult: Secondary | ICD-10-CM | POA: Diagnosis not present

## 2022-02-27 DIAGNOSIS — Z124 Encounter for screening for malignant neoplasm of cervix: Secondary | ICD-10-CM | POA: Diagnosis not present

## 2022-05-12 ENCOUNTER — Other Ambulatory Visit: Payer: Self-pay | Admitting: Obstetrics and Gynecology

## 2022-05-12 DIAGNOSIS — Z1231 Encounter for screening mammogram for malignant neoplasm of breast: Secondary | ICD-10-CM

## 2022-05-16 ENCOUNTER — Ambulatory Visit
Admission: RE | Admit: 2022-05-16 | Discharge: 2022-05-16 | Disposition: A | Discharge status: Home / Self Care | Payer: Commercial Managed Care - PPO | Source: Ambulatory Visit | Attending: Obstetrics and Gynecology | Admitting: Obstetrics and Gynecology

## 2022-05-16 ENCOUNTER — Other Ambulatory Visit: Payer: Self-pay | Admitting: Obstetrics and Gynecology

## 2022-05-16 DIAGNOSIS — Z1231 Encounter for screening mammogram for malignant neoplasm of breast: Secondary | ICD-10-CM | POA: Diagnosis not present

## 2022-07-15 IMAGING — US US BREAST*L* LIMITED INC AXILLA
1 series · 4 of 4 positions shown · non-contrast
Comparison: Previous exam(s).

CLINICAL DATA: Screening recall from baseline for a possible left
breast asymmetry.

EXAM:
DIGITAL DIAGNOSTIC UNILATERAL LEFT MAMMOGRAM WITH IMPLANTS, CAD AND
TOMOSYNTHESIS; ULTRASOUND LEFT BREAST LIMITED
TECHNIQUE: Left digital diagnostic mammography and breast tomosynthesis was
performed. The images were evaluated with computer-aided detection.
Standard and/or implant displaced views were performed.; Targeted
ultrasound examination of the left breast was performed.

[Series 1: us breast*left* limited inc axilla · 0.06mm/px · 4 of 4 slices shown]
[im 1/4]
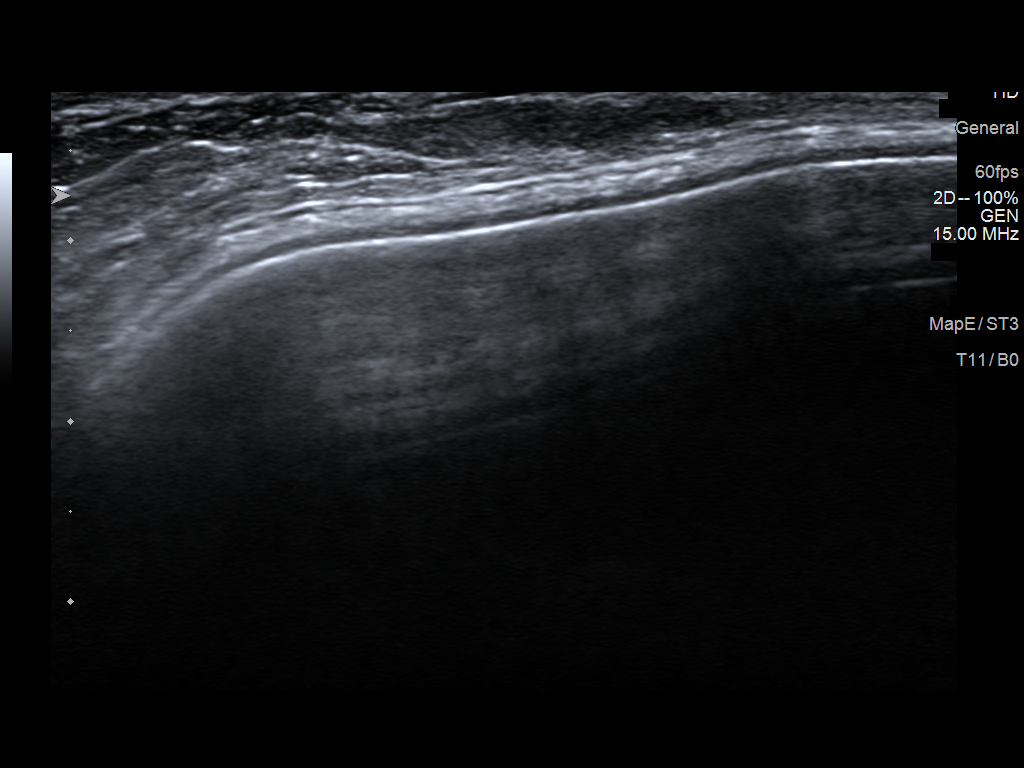
[im 2/4]
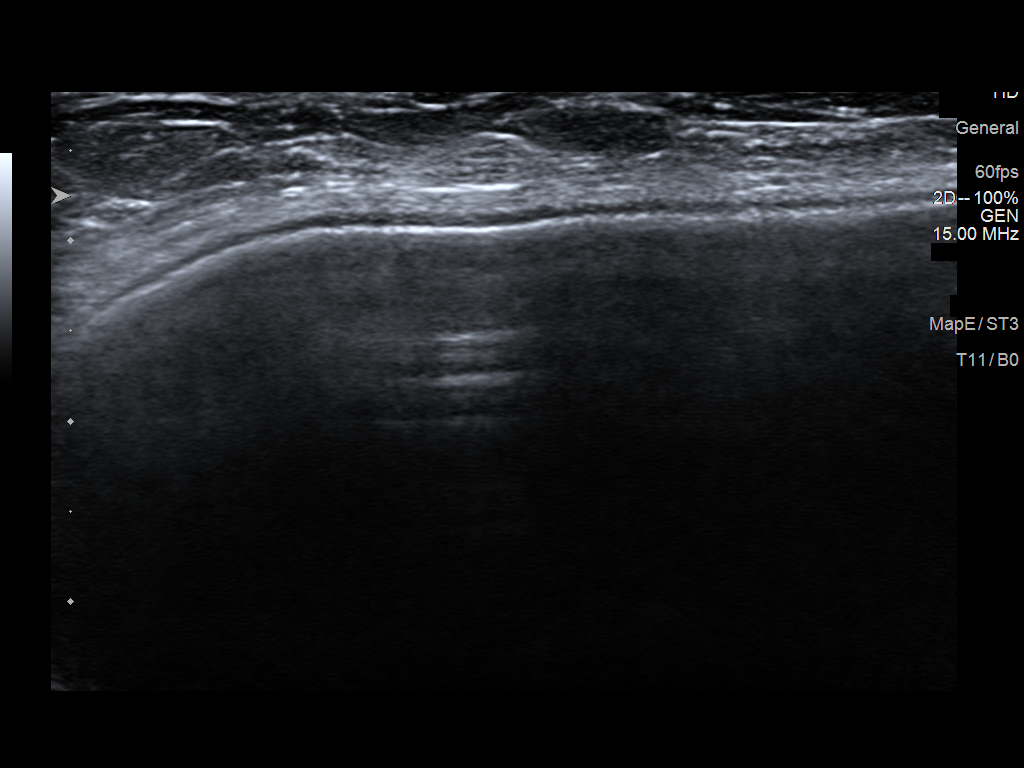
[im 3/4]
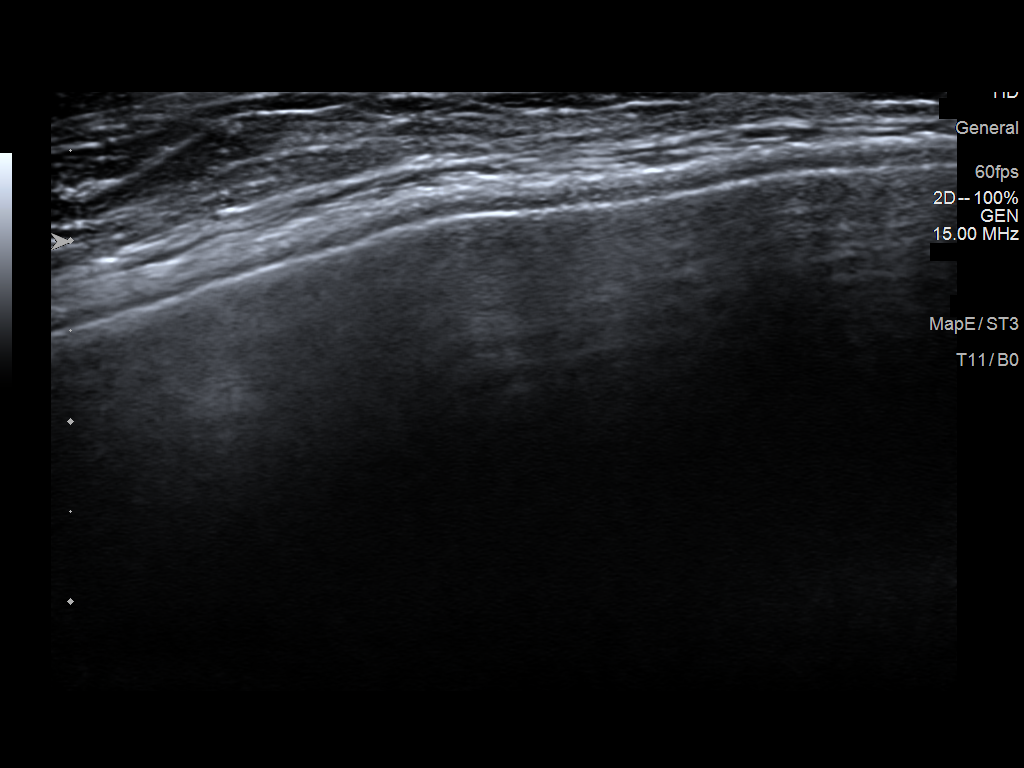
[im 4/4]
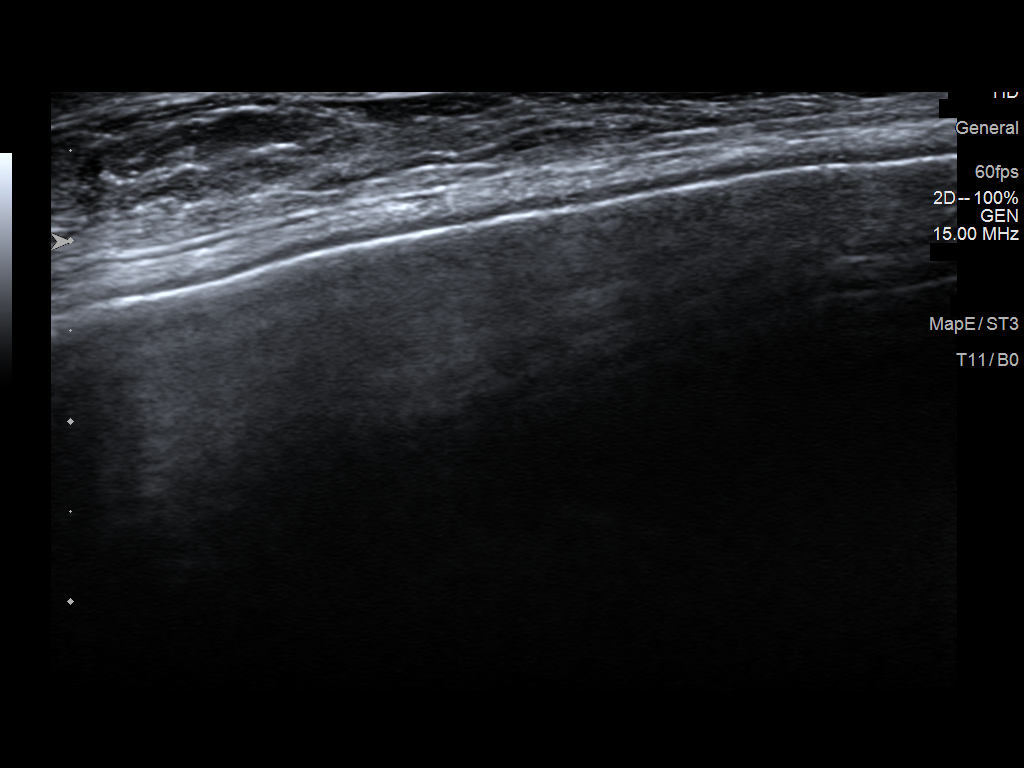

[4 of 4 positions shown; findings below may reference images not displayed]

ACR Breast Density Category c: The breast tissue is heterogeneously
dense, which may obscure small masses.
FINDINGS: Spot compression tomosynthesis images through the lateral posterior
left breast as well as a full paddle true lateral set of
tomosynthesis images demonstrates no persistent abnormalities. Due
to the density of the patient's breast tissue, ultrasound will be
performed for further confirmation. The patient has retropectoral
implants.

Ultrasound targeted to the lateral aspect of the left breast
demonstrates normal fibroglandular tissue. No suspicious masses or
areas of shadowing are identified.
IMPRESSION: No persistent mammographic or targeted sonographic abnormalities in
the lateral left breast.

RECOMMENDATION:
Screening mammogram in one year.(Code:IL-9-Z28)

I have discussed the findings and recommendations with the patient.
If applicable, a reminder letter will be sent to the patient
regarding the next appointment.

BI-RADS CATEGORY  1: Negative.

## 2022-07-15 IMAGING — MG MM DIGITAL DIAGNOSTIC UNILAT*L* IMPLANT W/ TOMO W/ CAD
4 series · 4 of 12 positions shown · non-contrast
Comparison: Previous exam(s).

CLINICAL DATA: Screening recall from baseline for a possible left
breast asymmetry.

EXAM:
DIGITAL DIAGNOSTIC UNILATERAL LEFT MAMMOGRAM WITH IMPLANTS, CAD AND
TOMOSYNTHESIS; ULTRASOUND LEFT BREAST LIMITED
TECHNIQUE: Left digital diagnostic mammography and breast tomosynthesis was
performed. The images were evaluated with computer-aided detection.
Standard and/or implant displaced views were performed.; Targeted
ultrasound examination of the left breast was performed.

[L MLO synth-2D]
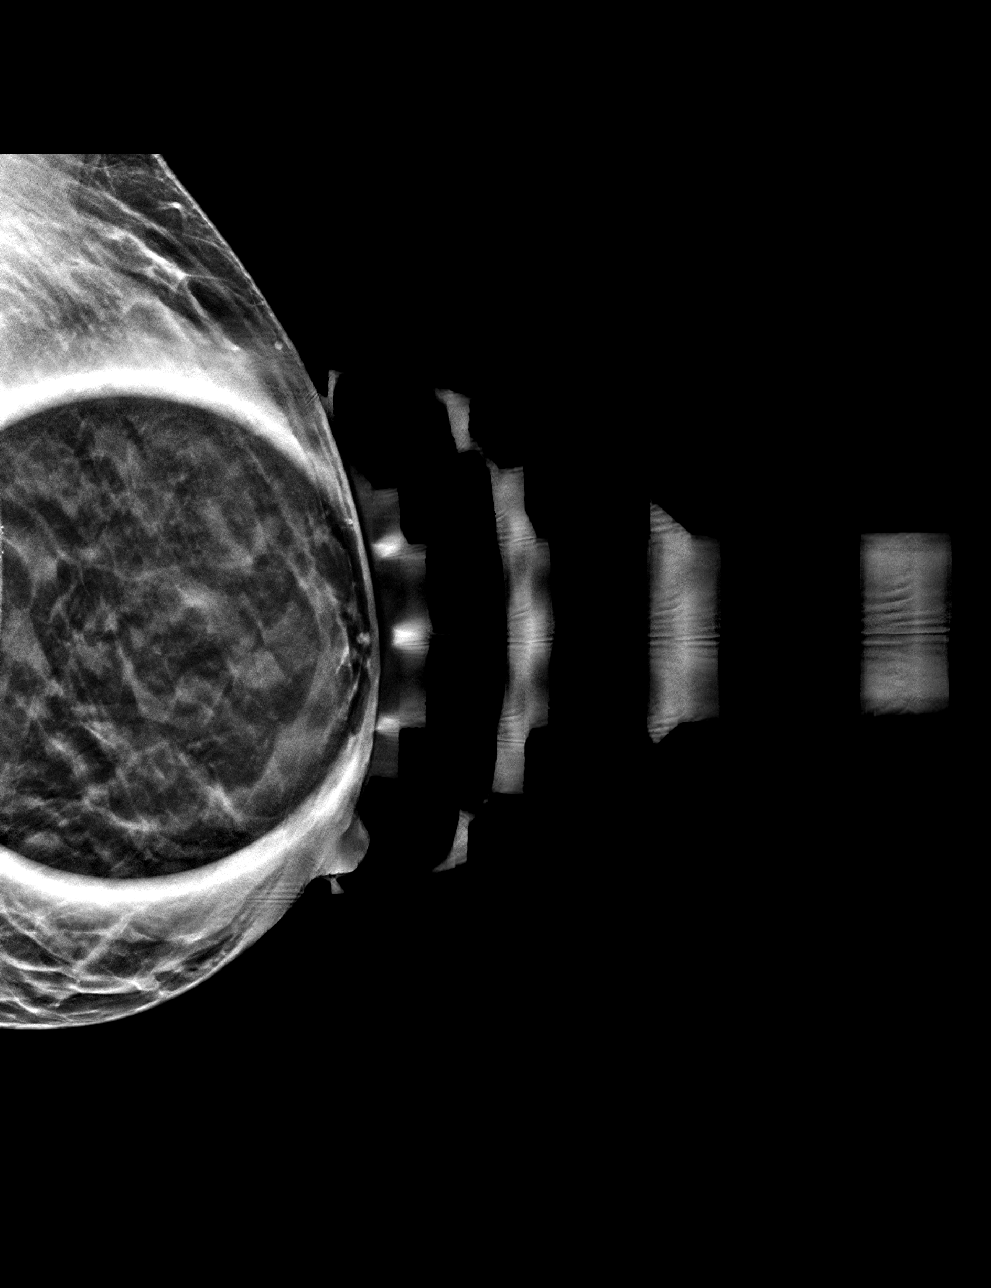

[L ML synth-2D]
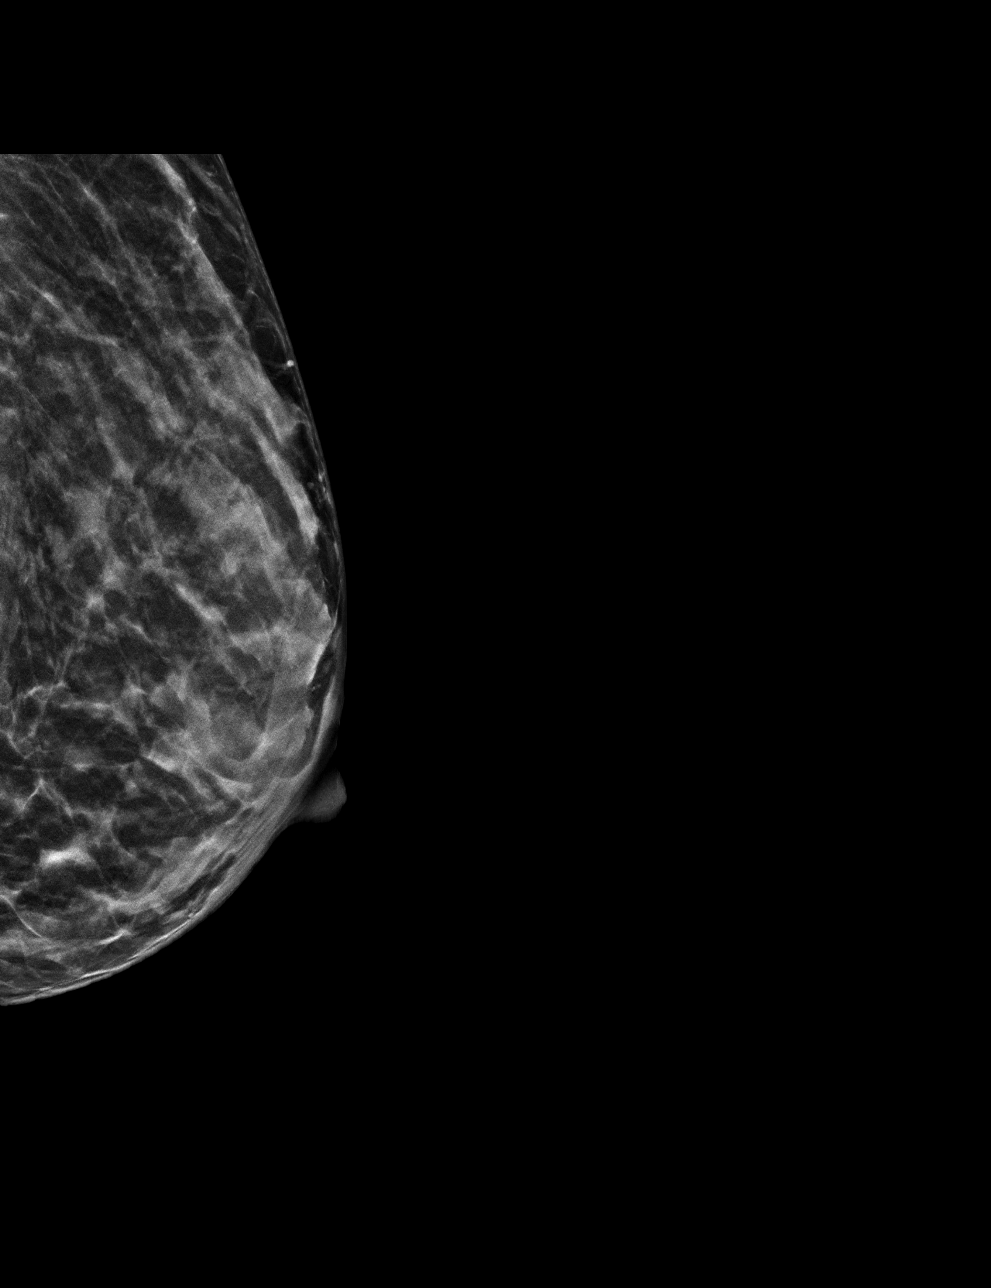

[L MLID BREAST TOMOSYNTHESIS IMAGE tomo · tomo slice 16/31.0]
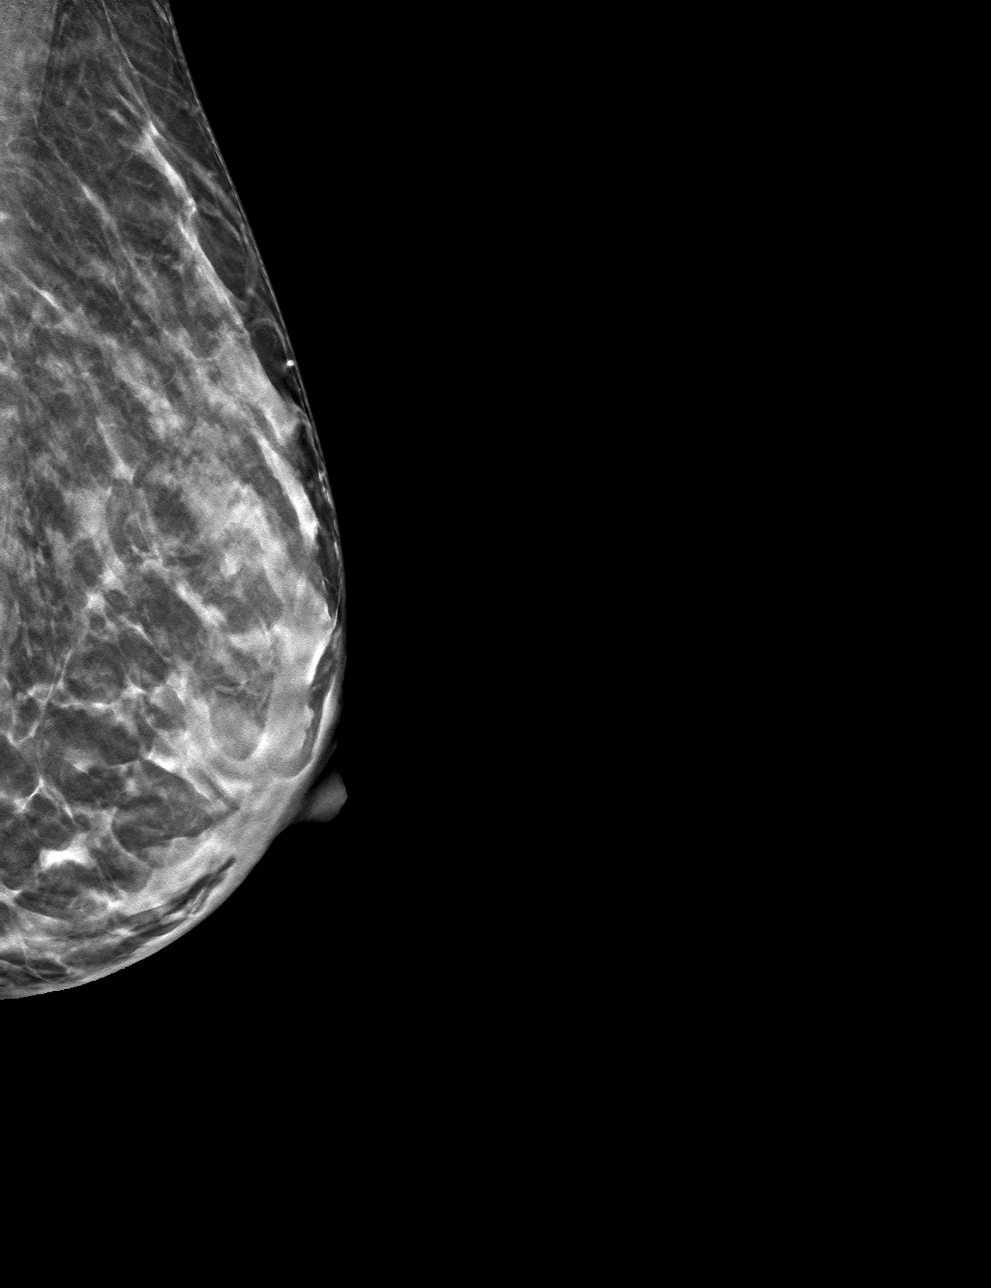

[L MLOID BREAST TOMOSYNTHESIS IMAGE tomo · tomo slice 15/30.0]
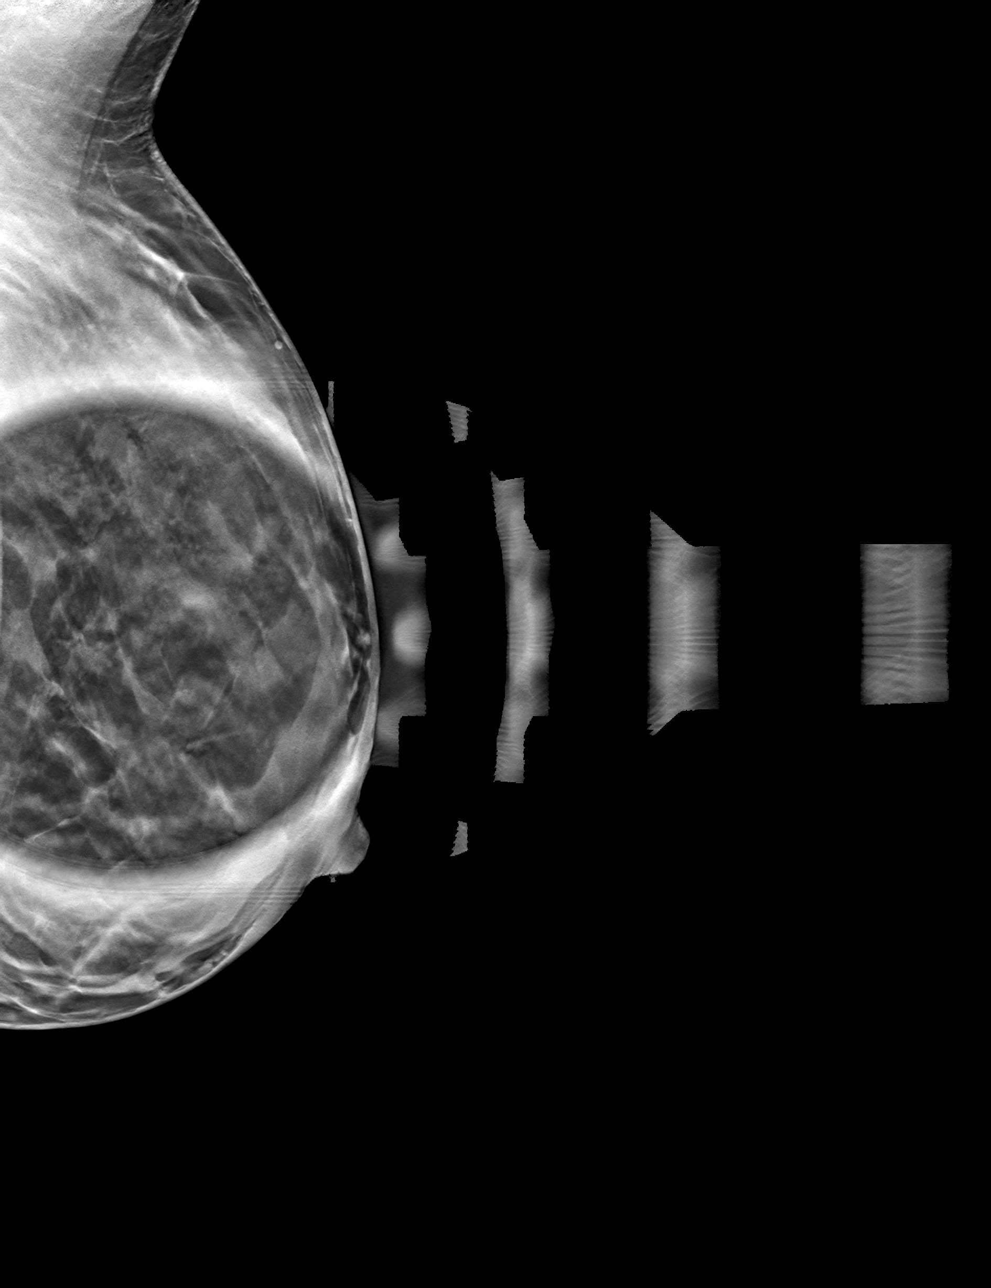

[4 of 12 positions shown; findings below may reference images not displayed]

ACR Breast Density Category c: The breast tissue is heterogeneously
dense, which may obscure small masses.
FINDINGS: Spot compression tomosynthesis images through the lateral posterior
left breast as well as a full paddle true lateral set of
tomosynthesis images demonstrates no persistent abnormalities. Due
to the density of the patient's breast tissue, ultrasound will be
performed for further confirmation. The patient has retropectoral
implants.

Ultrasound targeted to the lateral aspect of the left breast
demonstrates normal fibroglandular tissue. No suspicious masses or
areas of shadowing are identified.
IMPRESSION: No persistent mammographic or targeted sonographic abnormalities in
the lateral left breast.

RECOMMENDATION:
Screening mammogram in one year.(Code:IL-9-Z28)

I have discussed the findings and recommendations with the patient.
If applicable, a reminder letter will be sent to the patient
regarding the next appointment.

BI-RADS CATEGORY  1: Negative.

## 2022-08-08 DIAGNOSIS — Z1330 Encounter for screening examination for mental health and behavioral disorders, unspecified: Secondary | ICD-10-CM | POA: Diagnosis not present

## 2022-08-08 DIAGNOSIS — Z136 Encounter for screening for cardiovascular disorders: Secondary | ICD-10-CM | POA: Diagnosis not present

## 2022-08-08 DIAGNOSIS — Z13220 Encounter for screening for lipoid disorders: Secondary | ICD-10-CM | POA: Diagnosis not present

## 2022-08-08 DIAGNOSIS — E559 Vitamin D deficiency, unspecified: Secondary | ICD-10-CM | POA: Diagnosis not present

## 2022-08-08 DIAGNOSIS — Z1331 Encounter for screening for depression: Secondary | ICD-10-CM | POA: Diagnosis not present

## 2022-08-08 DIAGNOSIS — Z8249 Family history of ischemic heart disease and other diseases of the circulatory system: Secondary | ICD-10-CM | POA: Diagnosis not present

## 2022-08-08 DIAGNOSIS — Z0000 Encounter for general adult medical examination without abnormal findings: Secondary | ICD-10-CM | POA: Diagnosis not present

## 2022-08-08 DIAGNOSIS — R5383 Other fatigue: Secondary | ICD-10-CM | POA: Diagnosis not present

## 2022-12-26 DIAGNOSIS — J069 Acute upper respiratory infection, unspecified: Secondary | ICD-10-CM | POA: Diagnosis not present

## 2023-01-08 DIAGNOSIS — Z713 Dietary counseling and surveillance: Secondary | ICD-10-CM | POA: Diagnosis not present

## 2023-01-08 DIAGNOSIS — Z6824 Body mass index (BMI) 24.0-24.9, adult: Secondary | ICD-10-CM | POA: Diagnosis not present

## 2023-03-05 DIAGNOSIS — Z01419 Encounter for gynecological examination (general) (routine) without abnormal findings: Secondary | ICD-10-CM | POA: Diagnosis not present

## 2023-03-05 DIAGNOSIS — Z6823 Body mass index (BMI) 23.0-23.9, adult: Secondary | ICD-10-CM | POA: Diagnosis not present

## 2023-04-12 NOTE — Progress Notes (Unsigned)
 Cardiology Office Note:    Date:  04/13/2023   ID:  Ariel Joseph, DOB May 31, 1980, MRN [id number]  PCP:  Ariel Baas, MD  Cardiologist:  None  Electrophysiologist:  None   Referring MD: Ariel Baas, MD   Chief Complaint  Patient presents with   Family history of HCM    History of Present Illness:    Ariel Joseph is a 43 y.o. female with no significant past medical history who is referred by Dr. Nemiah Commander for evaluation of family history of hypertrophic cardiomyopathy.  Denies any chest pain, dyspnea, lightheadedness, syncope, lower extremity edema, or palpitations.  Smoked for 7 years 1pack/year, quit in 2008.  Both her mother and brother have hypertrophic cardiomyopathy, and were patients of mine but now following with Dr. Izora Ribas in [facility] clinic.   Past Medical History:  Diagnosis Date   Fibroid    Hx of chlamydia infection    Medical history non-contributory     Past Surgical History:  Procedure Laterality Date   AUGMENTATION MAMMAPLASTY Bilateral 2007   NO PAST SURGERIES     WISDOM TOOTH EXTRACTION  01/27/1998    Current Medications: Current Meds  Medication Sig   Semaglutide-Weight Management (WEGOVY [place]) Inject as directed once a week.     Allergies:   Patient has no known allergies.   Social History   Socioeconomic History   Marital status: Married    Spouse name: Not on file   Number of children: Not on file   Years of education: Not on file   Highest education level: Not on file  Occupational History   Not on file  Tobacco Use   Smoking status: Former   Smokeless tobacco: Never  Substance and Sexual Activity   Alcohol use: Yes    Comment: socially   Drug use: No   Sexual activity: Yes    Birth control/protection: None  Other Topics Concern   Not on file  Social History Narrative   Not on file   Social Drivers of Health   Financial Resource Strain: Low Risk  (08/08/2022)   Received from [facility]   Overall  Financial Resource Strain (CARDIA)    Difficulty of Paying Living Expenses: Not hard at all  Food Insecurity: No Food Insecurity (08/08/2022)   Received [facility]   Hunger Vital Sign    Worried About Running Out of Food in the Last Year: Never true    Ran Out of Food in the Last Year: Never true  Transportation Needs: No Transportation Needs (08/08/2022)   Received from [facility] - Transportation    Lack of Transportation (Medical): No    Lack of Transportation (Non-Medical): No  Physical Activity: Not on file  Stress: Not on file  Social Connections: Unknown (07/24/2022)   [facility]   Social Network    Social Network: Not on file     Family History: The patient's family history includes Acute lymphoblastic leukemia in her maternal grandfather; Bipolar disorder in her maternal grandfather; Diabetes in her maternal grandmother; Drug abuse in her maternal aunt; Hypertension in her brother and maternal grandmother; Kidney cancer in her maternal grandfather; Lung cancer in her maternal grandfather; Thrombosis in her mother; Thyroid disease in her father and mother.  ROS:   Please see the history of present illness.     All other systems reviewed and are negative.  EKGs/Labs/Other Studies Reviewed:    The following studies were reviewed today:   EKG:  04/13/23: sinus bradycardia with PVC, rate 59  Recent Labs: No results found for requested labs within last 365 days.  Recent Lipid Panel No results found for: "CHOL", "TRIG", "HDL", "CHOLHDL", "VLDL", "LDLCALC", "LDLDIRECT"  Physical Exam:    VS:  BP 112/70 (BP Location: Left Arm, Patient Position: Sitting, Cuff Size: Small)   Pulse (!) 59   Ht 5\' 3"  (1.6 m)   Wt 130 lb 12.8 oz (59.3 kg)   BMI 23.17 kg/m     Wt Readings from Last 3 Encounters:  04/13/23 130 lb 12.8 oz (59.3 kg)  06/12/15 144 lb (65.3 kg)     GEN:  Well nourished, well developed in no acute distress HEENT:  Normal NECK: No JVD; No carotid bruits LYMPHATICS: No lymphadenopathy CARDIAC: RRR, no murmurs, rubs, gallops RESPIRATORY:  Clear to auscultation without rales, wheezing or rhonchi  ABDOMEN: Soft, non-tender, non-distended MUSCULOSKELETAL:  No edema; No deformity  SKIN: Warm and dry NEUROLOGIC:  Alert and oriented x 3 PSYCHIATRIC:  Normal affect   ASSESSMENT:    1. Family history of hypertrophic cardiomyopathy    PLAN:    Family history of cardiomyopathy: Both her mother and brother have hypertrophic obstructive cardiomyopathy.  Recommend echocardiogram for screening  RTC in 1 year  Medication Adjustments/Labs and Tests Ordered: Current medicines are reviewed at length with the patient today.  Concerns regarding medicines are outlined above.  Orders Placed This Encounter  Procedures   EKG 12-Lead   ECHOCARDIOGRAM COMPLETE   No orders of the defined types were placed in this encounter.   Patient Instructions  Medication Instructions:  Your physician recommends that you continue on your current medications as directed. Please refer to the Current Medication list given to you today.  *If you need a refill on your cardiac medications before your next appointment, please call your pharmacy*   Testing/Procedures: Your physician has requested that you have an echocardiogram. Echocardiography is a painless test that uses sound waves to create images of your heart. It provides your doctor with information about the size and shape of your heart and how well your heart's chambers and valves are working. This procedure takes approximately one hour. There are no restrictions for this procedure. Please do NOT wear cologne, perfume, aftershave, or lotions (deodorant is allowed). Please arrive 15 minutes prior to your appointment time.    Follow-Up: At [facility], you and your health needs are our priority.  As part of our continuing mission to provide you with exceptional  heart care, we have created designated Provider Care Teams.  These Care Teams include your primary Cardiologist (physician) and Advanced Practice Providers (APPs -  Physician Assistants and Nurse Practitioners) who all work together to provide you with the care you need, when you need it.   Your next appointment:   12 month(s)  Provider:   Dr. Bjorn Pippin  Other Instructions         Signed, Little Ishikawa, MD  04/13/2023 10:16 AM    [facility]

## 2023-04-13 ENCOUNTER — Ambulatory Visit: Payer: Commercial Managed Care - PPO | Attending: Cardiology | Admitting: Cardiology

## 2023-04-13 ENCOUNTER — Encounter: Payer: Self-pay | Admitting: Cardiology

## 2023-04-13 VITALS — BP 112/70 | HR 59 | Ht 63.0 in | Wt 130.8 lb

## 2023-04-13 DIAGNOSIS — Z8249 Family history of ischemic heart disease and other diseases of the circulatory system: Secondary | ICD-10-CM

## 2023-04-13 NOTE — Patient Instructions (Signed)
 Medication Instructions:  Your physician recommends that you continue on your current medications as directed. Please refer to the Current Medication list given to you today.  *If you need a refill on your cardiac medications before your next appointment, please call your pharmacy*   Testing/Procedures: Your physician has requested that you have an echocardiogram. Echocardiography is a painless test that uses sound waves to create images of your heart. It provides your doctor with information about the size and shape of your heart and how well your heart's chambers and valves are working. This procedure takes approximately one hour. There are no restrictions for this procedure. Please do NOT wear cologne, perfume, aftershave, or lotions (deodorant is allowed). Please arrive 15 minutes prior to your appointment time.    Follow-Up: At [facility], you and your health needs are our priority.  As part of our continuing mission to provide you with exceptional heart care, we have created designated Provider Care Teams.  These Care Teams include your primary Cardiologist (physician) and Advanced Practice Providers (APPs -  Physician Assistants and Nurse Practitioners) who all work together to provide you with the care you need, when you need it.   Your next appointment:   12 month(s)  Provider:   Dr. Bjorn Pippin  Other Instructions

## 2023-04-30 DIAGNOSIS — Z713 Dietary counseling and surveillance: Secondary | ICD-10-CM | POA: Diagnosis not present

## 2023-04-30 DIAGNOSIS — Z6822 Body mass index (BMI) 22.0-22.9, adult: Secondary | ICD-10-CM | POA: Diagnosis not present

## 2023-05-14 ENCOUNTER — Ambulatory Visit (HOSPITAL_COMMUNITY)

## 2023-06-16 ENCOUNTER — Ambulatory Visit (HOSPITAL_COMMUNITY)

## 2023-07-08 ENCOUNTER — Other Ambulatory Visit: Payer: Self-pay | Admitting: Obstetrics and Gynecology

## 2023-07-08 DIAGNOSIS — Z1231 Encounter for screening mammogram for malignant neoplasm of breast: Secondary | ICD-10-CM

## 2023-07-21 ENCOUNTER — Ambulatory Visit (HOSPITAL_COMMUNITY)

## 2023-08-07 ENCOUNTER — Ambulatory Visit

## 2023-10-06 ENCOUNTER — Ambulatory Visit (HOSPITAL_COMMUNITY)

## 2023-10-12 ENCOUNTER — Ambulatory Visit
Admission: RE | Admit: 2023-10-12 | Discharge: 2023-10-12 | Disposition: A | Discharge status: Home / Self Care | Source: Ambulatory Visit | Attending: Obstetrics and Gynecology | Admitting: Obstetrics and Gynecology

## 2023-10-12 DIAGNOSIS — Z1231 Encounter for screening mammogram for malignant neoplasm of breast: Secondary | ICD-10-CM | POA: Diagnosis not present

## 2023-10-14 ENCOUNTER — Other Ambulatory Visit: Payer: Self-pay | Admitting: Obstetrics and Gynecology

## 2023-10-14 DIAGNOSIS — R928 Other abnormal and inconclusive findings on diagnostic imaging of breast: Secondary | ICD-10-CM

## 2023-10-27 ENCOUNTER — Ambulatory Visit
Admission: RE | Admit: 2023-10-27 | Discharge: 2023-10-27 | Disposition: A | Source: Ambulatory Visit | Attending: Obstetrics and Gynecology | Admitting: Obstetrics and Gynecology

## 2023-10-27 ENCOUNTER — Ambulatory Visit
Admission: RE | Admit: 2023-10-27 | Discharge: 2023-10-27 | Disposition: A | Discharge status: Home / Self Care | Source: Ambulatory Visit | Attending: Obstetrics and Gynecology | Admitting: Obstetrics and Gynecology

## 2023-10-27 DIAGNOSIS — R928 Other abnormal and inconclusive findings on diagnostic imaging of breast: Secondary | ICD-10-CM

## 2023-10-27 DIAGNOSIS — N6489 Other specified disorders of breast: Secondary | ICD-10-CM | POA: Diagnosis not present
